# Patient Record
Sex: Female | Born: 1940 | Race: White | Hispanic: No | Marital: Married | Smoking: Never smoker
Health system: Southern US, Community
[De-identification: ages and names within clinical notes are randomized; demographics above are authoritative.]

## PROBLEM LIST (undated history)

## (undated) DIAGNOSIS — Z9289 Personal history of other medical treatment: Secondary | ICD-10-CM

## (undated) DIAGNOSIS — I471 Supraventricular tachycardia, unspecified: Secondary | ICD-10-CM

## (undated) DIAGNOSIS — J309 Allergic rhinitis, unspecified: Secondary | ICD-10-CM

## (undated) DIAGNOSIS — K648 Other hemorrhoids: Secondary | ICD-10-CM

## (undated) DIAGNOSIS — E785 Hyperlipidemia, unspecified: Secondary | ICD-10-CM

## (undated) DIAGNOSIS — Z5189 Encounter for other specified aftercare: Secondary | ICD-10-CM

## (undated) DIAGNOSIS — K449 Diaphragmatic hernia without obstruction or gangrene: Secondary | ICD-10-CM

## (undated) DIAGNOSIS — H269 Unspecified cataract: Secondary | ICD-10-CM

## (undated) DIAGNOSIS — K219 Gastro-esophageal reflux disease without esophagitis: Secondary | ICD-10-CM

## (undated) DIAGNOSIS — M1990 Unspecified osteoarthritis, unspecified site: Secondary | ICD-10-CM

## (undated) DIAGNOSIS — M810 Age-related osteoporosis without current pathological fracture: Secondary | ICD-10-CM

## (undated) DIAGNOSIS — Z860101 Personal history of adenomatous and serrated colon polyps: Secondary | ICD-10-CM

## (undated) DIAGNOSIS — A048 Other specified bacterial intestinal infections: Secondary | ICD-10-CM

## (undated) DIAGNOSIS — Z86010 Personal history of colonic polyps: Secondary | ICD-10-CM

## (undated) DIAGNOSIS — T8149XA Infection following a procedure, other surgical site, initial encounter: Secondary | ICD-10-CM

## (undated) DIAGNOSIS — J45909 Unspecified asthma, uncomplicated: Secondary | ICD-10-CM

## (undated) HISTORY — DX: Supraventricular tachycardia: I47.1

## (undated) HISTORY — PX: OTHER SURGICAL HISTORY: SHX169

## (undated) HISTORY — PX: TOE SURGERY: SHX1073

## (undated) HISTORY — DX: Encounter for other specified aftercare: Z51.89

## (undated) HISTORY — DX: Personal history of adenomatous and serrated colon polyps: Z86.0101

## (undated) HISTORY — PX: TONSILLECTOMY: SUR1361

## (undated) HISTORY — DX: Age-related osteoporosis without current pathological fracture: M81.0

## (undated) HISTORY — DX: Gastro-esophageal reflux disease without esophagitis: K21.9

## (undated) HISTORY — DX: Personal history of other medical treatment: Z92.89

## (undated) HISTORY — PX: COLONOSCOPY: SHX174

## (undated) HISTORY — DX: Allergic rhinitis, unspecified: J30.9

## (undated) HISTORY — DX: Hyperlipidemia, unspecified: E78.5

## (undated) HISTORY — DX: Other specified bacterial intestinal infections: A04.8

## (undated) HISTORY — DX: Diaphragmatic hernia without obstruction or gangrene: K44.9

## (undated) HISTORY — PX: TOTAL ABDOMINAL HYSTERECTOMY: SHX209

## (undated) HISTORY — DX: Other hemorrhoids: K64.8

## (undated) HISTORY — DX: Unspecified cataract: H26.9

## (undated) HISTORY — DX: Unspecified osteoarthritis, unspecified site: M19.90

## (undated) HISTORY — DX: Unspecified asthma, uncomplicated: J45.909

## (undated) HISTORY — DX: Personal history of colonic polyps: Z86.010

## (undated) HISTORY — PX: BILATERAL SALPINGOOPHORECTOMY: SHX1223

## (undated) HISTORY — DX: Supraventricular tachycardia, unspecified: I47.10

## (undated) HISTORY — PX: APPENDECTOMY: SHX54

---

## 2005-02-20 ENCOUNTER — Ambulatory Visit: Payer: Self-pay | Admitting: Internal Medicine

## 2005-02-21 ENCOUNTER — Ambulatory Visit: Payer: Self-pay | Admitting: Internal Medicine

## 2005-07-02 ENCOUNTER — Ambulatory Visit: Payer: Self-pay | Admitting: Internal Medicine

## 2005-10-23 ENCOUNTER — Ambulatory Visit: Payer: Self-pay | Admitting: Internal Medicine

## 2006-02-05 ENCOUNTER — Ambulatory Visit: Payer: Self-pay | Admitting: Internal Medicine

## 2006-03-18 ENCOUNTER — Ambulatory Visit: Payer: Self-pay | Admitting: Internal Medicine

## 2006-03-19 ENCOUNTER — Ambulatory Visit: Payer: Self-pay | Admitting: Internal Medicine

## 2006-07-30 ENCOUNTER — Ambulatory Visit: Payer: Self-pay | Admitting: Internal Medicine

## 2006-12-07 ENCOUNTER — Ambulatory Visit: Payer: Self-pay | Admitting: Internal Medicine

## 2007-03-08 ENCOUNTER — Ambulatory Visit: Payer: Self-pay | Admitting: Internal Medicine

## 2007-04-19 ENCOUNTER — Ambulatory Visit: Payer: Self-pay | Admitting: Internal Medicine

## 2007-09-27 ENCOUNTER — Ambulatory Visit: Payer: Self-pay | Admitting: Internal Medicine

## 2008-02-11 ENCOUNTER — Ambulatory Visit: Payer: Self-pay | Admitting: Internal Medicine

## 2008-03-30 DIAGNOSIS — J45998 Other asthma: Secondary | ICD-10-CM

## 2008-03-30 DIAGNOSIS — I471 Supraventricular tachycardia: Secondary | ICD-10-CM

## 2008-03-30 DIAGNOSIS — J302 Other seasonal allergic rhinitis: Secondary | ICD-10-CM | POA: Insufficient documentation

## 2008-03-30 DIAGNOSIS — J3089 Other allergic rhinitis: Secondary | ICD-10-CM

## 2008-03-31 ENCOUNTER — Ambulatory Visit: Payer: Self-pay | Admitting: Internal Medicine

## 2008-04-10 ENCOUNTER — Telehealth: Payer: Self-pay | Admitting: Internal Medicine

## 2008-06-21 ENCOUNTER — Ambulatory Visit: Payer: Self-pay | Admitting: Internal Medicine

## 2008-11-28 ENCOUNTER — Ambulatory Visit: Payer: Self-pay | Admitting: Internal Medicine

## 2009-04-02 ENCOUNTER — Ambulatory Visit: Payer: Self-pay | Admitting: Internal Medicine

## 2009-04-03 ENCOUNTER — Ambulatory Visit: Payer: Self-pay | Admitting: Internal Medicine

## 2009-08-20 ENCOUNTER — Ambulatory Visit: Payer: Self-pay | Admitting: Internal Medicine

## 2009-08-20 DIAGNOSIS — J209 Acute bronchitis, unspecified: Secondary | ICD-10-CM

## 2009-09-07 ENCOUNTER — Encounter: Payer: Self-pay | Admitting: Internal Medicine

## 2009-09-12 ENCOUNTER — Telehealth: Payer: Self-pay | Admitting: Internal Medicine

## 2010-02-04 ENCOUNTER — Ambulatory Visit: Payer: Self-pay | Admitting: Internal Medicine

## 2010-05-23 ENCOUNTER — Ambulatory Visit: Payer: Self-pay | Admitting: Internal Medicine

## 2010-06-26 ENCOUNTER — Ambulatory Visit: Payer: Self-pay | Admitting: Internal Medicine

## 2010-10-29 ENCOUNTER — Ambulatory Visit: Payer: Self-pay | Admitting: Internal Medicine

## 2011-01-07 NOTE — Assessment & Plan Note (Signed)
Summary: rov/jd   Primary Provider/Referring Provider:  Grace [name]  CC:  follow up visit-asthma and allergies. No Complaints..  History of Present Illness: PAROXYSMAL ATRIAL TACHYCARDIA (ICD-427.0) ASTHMA (ICD-493.90) ALLERGIC RHINITIS (ICD-477.9)  03/31/08- Annual follow-up for Cathy [name].  Her asthma and allergic rhinitis have done well.  She has continued allergy vaccine at 1:10 at her primary physicianphysician's office in [facility].  Last week  she did happen to need an antibiotic and a shot for bronchitis symptoms.  She is feeling better, but has some residual left maxillary pressure discomfort.  04-26-09- Asthma, allergic rhinitis Dr Melvyn Neth office continues to give her allergy shots without problem "works great". Tolerated podiatric procedure on her toe under aneshesia without problem. we reviewed her respiratory meds which are doing well. Denies sinus pain/ headache, fever.  August 20, 2009--Presents for an acute office viist. complains of prod cough with yellow mucus, increased SOB x82month. Clear mucus and nasal congestion at first then thick yellow mucus for last 2 weeks. Denies chest pain, dyspnea, orthopnea, hemoptysis, fever, n/v/d, edema, headache,recent travel or antibiotics.    May 23, 2010- Asthma, allergic rhinitis Has done very well since last here. Continues allergy vaccine through her primary office. Rare need for rescue inhaler- usually uses it ahead of heavy exertion. Uses Qvar regularly. Han't needed Astepro in a year. Never smoked.      Asthma History    Initial Asthma Severity Rating:    Age range: 12+ years    Symptoms: 0-2 days/week    Nighttime Awakenings: 0-2/month    Interferes w/ normal activity: no limitations    SABA use (not for EIB): 0-2 days/week    Asthma Severity Assessment: Intermittent   Preventive Screening-Counseling & Management  Alcohol-Tobacco     Smoking Status: never  Current Medications  (verified): 1)  Qvar 40 Mcg/act Aers (Beclomethasone Dipropionate) .... 2 Puffs and Rinse Twice Daily 2)  Allergy Vaccine 1:10 .... At Dr. Melvyn Neth' Office 3)  Proventil Hfa 108 (90 Base) Mcg/act  Aers (Albuterol Sulfate) .... Inhale 2 Puffs Every 4 To 6 Hours As Needed 4)  Astepro 137 Mcg/spray  Soln (Azelastine Hcl) .... Take 1-2 Sprays in Each Nostril Two Times A Day As Needed 5)  Pindolol 5 Mg  Tabs (Pindolol) .... Take 1 Tablet By Mouth Once A Day 6)  Premarin 0.625 Mg Tabs (Estrogens Conjugated) .... Take 1 Tablet By Mouth Once A Day 7)  Actonel 35 Mg Tabs (Risedronate Sodium) .... Once A Week 8)  Vitamin D (Ergocalciferol) 50000 Unit Caps (Ergocalciferol) .... Weekly 9)  Dicyclomine Hcl 10 Mg Caps (Dicyclomine Hcl) .... Take 1-4 Once Daily 10)  Flexeril 5 Mg Tabs (Cyclobenzaprine Hcl) .... At Bedtime 11)  Caltrate 600 1500 Mg Tabs (Calcium Carbonate) .... Take 1 Once Daily 12)  Multivitamins  Tabs (Multiple Vitamin) .... Take 1 By Mouth Once Daily  Allergies (verified): 1)  ! 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  [employer]  Risk Factors: Smoking Status: never (05/23/2010)  Review of Systems      See HPI  The patient denies shortness of breath with  activity, shortness of breath at rest, productive cough, non-productive cough, coughing up blood, chest pain, irregular heartbeats, acid heartburn, indigestion, loss of appetite, weight change, abdominal pain, difficulty swallowing, sore throat, tooth/dental problems,  headaches, nasal congestion/difficulty breathing through nose, and sneezing.    Vital Signs:  Patient profile:   70 year old female Height:      67 inches Weight:      166 pounds BMI:     26.09 O2 Sat:      97 % on Room air Pulse rate:   65 / minute BP sitting:   114 / 62  (left arm) Cuff size:   regular  Vitals Entered By: Reynaldo Minium CMA (May 23, 2010 2:10 PM)  O2 Flow:  Room air CC: follow up visit-asthma and allergies. No Complaints.   Physical Exam  Additional Exam:  .General: A/Ox3; pleasant and cooperative, NAD, wdwn SKIN: no rash, lesions NODES: no lymphadenopathy HEENT: [place]/AT, EOM- WNL, Conjuctivae- clear, PERRLA, TM-WNL, Nose- pale clear drainage.  Throat- clear and wnl, Mallampati II NECK: Supple w/ fair ROM, JVD- none, normal carotid impulses w/o bruits Thyroid- normal to palpation CHEST: Clear to P&A HEART: RRR, no m/g/r heard ABDOMEN:  YNW:GNFA, nl pulses, no edema  NEURO: Grossly intact to observation      Impression & Recommendations:  Problem # 1:  ASTHMA (ICD-493.90) She is very well controlled- mild intermittent. We discussed recommendation from her insurance that she ask about PFT. We discussed this. She didn't want it unless I felt it was important. Her exam and hx do not suggest that PFT now would change treatment decisions.  Problem # 2:  ALLERGIC RHINITIS (ICD-477.9)  We will not refill Astepro for now. Her updated medication list for this problem includes:    Astepro 137 Mcg/spray Soln (Azelastine hcl) .Marland Kitchen... Take 1-2 sprays in each nostril two times a day as needed  Problem # 3:  PAROXYSMAL ATRIAL TACHYCARDIA (ICD-427.0) No problem in a long time. Discussed stimulant meds. Her updated medication list for this problem includes:    Pindolol 5 Mg Tabs (Pindolol) .Marland Kitchen... Take 1 tablet by mouth once a day  Medications Added to Medication List This Visit: 1)  Vitamin D (ergocalciferol) 50000 Unit Caps (Ergocalciferol) .... Weekly 2)   Dicyclomine Hcl 10 Mg Caps (Dicyclomine hcl) .... Take 1-4 once daily 3)  Flexeril 5 Mg Tabs (Cyclobenzaprine hcl) .... At bedtime 4)  Caltrate 600 1500 Mg Tabs (Calcium carbonate) .... Take 1 once daily 5)  Multivitamins Tabs (Multiple vitamin) .... Take 1 by mouth once daily  Other Orders: Est. Patient Level IV (21308)  Patient Instructions: 1)  Please schedule a follow-up appointment in 1 year. 2)  Refill scripts for Qvar and Proventil 3)  continue allergy vaccine. Prescriptions: PROVENTIL HFA 108 (90 BASE) MCG/ACT  AERS (ALBUTEROL SULFATE) inhale 2 puffs every 4 to 6 hours as needed  #1 x prn   Entered and Authorized by:   Waymon Budge MD   Signed by:   Waymon Budge MD on 05/23/2010   Method used:   Print then Give to Patient   RxID:   [id number] QVAR 40 MCG/ACT AERS (BECLOMETHASONE DIPROPIONATE) 2 puffs and rinse twice daily  #1 x prn   Entered and Authorized by:   Waymon Budge MD   Signed by:   Waymon Budge MD on 05/23/2010   Method used:   Print then Give to Patient   RxID:   [id number]

## 2011-04-01 ENCOUNTER — Ambulatory Visit (INDEPENDENT_AMBULATORY_CARE_PROVIDER_SITE_OTHER): Payer: BC Managed Care – PPO

## 2011-04-01 DIAGNOSIS — J309 Allergic rhinitis, unspecified: Secondary | ICD-10-CM

## 2011-04-25 NOTE — Assessment & Plan Note (Signed)
[facility]                             PULMONARY OFFICE NOTE   Cathy, [name]                      MRN:          [id number]  DATE:03/09/2007                            DOB:          1941-11-17    PROBLEM LIST:  1. Asthma.  2. Allergic rhinitis.  3. History of paroxysmal atrial tachycardia.   HISTORY:  One-year followup.  Good winter.  No special problems.  She  was using albuterol only before exercise.  No spring pollen problems as  yet; says she uses Nasonex.  She had pneumococcal vaccine around 2005.  She continues allergy vaccine 1-10 at her doctor's office, with no  problems.   MEDICATIONS:  1. Allergy vaccine.  2. Pulmacort one puff b.i.d.  3. Pindolol 5 mg.  4. Diazepam 5 mg.  5. Premarin 1.25 mg.  6. Actonel.  7. Nasonex.  8. Albuterol rescue inhaler p.r.n.   DRUG INTOLERANCE:  SULFA.   OBJECTIVE:  VITAL SIGNS:  Weight 156 pounds, which if correct is a 9  pound weight loss over the past year.  BP 118/78, pulse regular 91, room  air saturation 97%.  GENERAL:  She seems alert and comfortable.  HEENT:  Nasal airway is clear.  Oropharynx clear.  LUNGS:  Clear to percussion and auscultation.  HEART:  Heart sounds regular without murmur.   DIAGNOSTIC TESTING:  February 05, 2006 film showed slight hyperaeration,  with no active lung disease.   Pulmonary function tests from March 18, 2006 showed mild obstructive  disease, with small airway involvement and some response to  bronchodilator.  FEV1:FVC ratio was 0.68, with FEV1 after bronchodilator  2.80 (118% of predicted).  There was reversible slowing in small  airways.  Measured lung volumes were normal.  Diffusion was moderately  reduced at 64% of predicted.   IMPRESSION:  1. Asthma.  2. Allergic rhinitis.   PLAN:  Continue Pulmacort and p.r.n. albuterol.  Continue allergy  vaccine at 1-10.  Schedule return in one year, earlier p.r.n.     [name], MD, [name], FACP  Electronically Signed    CDY/MedQ  DD: 03/14/2007  DT: 03/15/2007  Job #: [id number]   cc:   Sharin Mons, M.D.

## 2011-04-28 ENCOUNTER — Ambulatory Visit: Payer: Self-pay | Admitting: Internal Medicine

## 2011-05-28 ENCOUNTER — Ambulatory Visit (INDEPENDENT_AMBULATORY_CARE_PROVIDER_SITE_OTHER): Payer: Medicare Other | Admitting: Internal Medicine

## 2011-05-28 ENCOUNTER — Encounter: Payer: Self-pay | Admitting: Internal Medicine

## 2011-05-28 DIAGNOSIS — J45909 Unspecified asthma, uncomplicated: Secondary | ICD-10-CM

## 2011-05-28 DIAGNOSIS — J309 Allergic rhinitis, unspecified: Secondary | ICD-10-CM

## 2011-05-28 NOTE — Progress Notes (Signed)
  Subjective:    Patient ID: Taaliyah Delpriore, female    DOB: 12-05-1941, 70 y.o.   MRN: [id number]  HPI 05/28/11 Last here May 23, 2010- note reviewed Has broken toes. Ohtherwise she says she has done very well and she is satisfied without reason to make changes. She continues allergy vaccine getting shots at Dr Majel Homer in [place].   Review of Systems     Objective:   Physical Exam General- Alert, Oriented, Affect-appropriate, Distress- none acute  Skin- rash-none, lesions- none, excoriation- none  Lymphadenopathy- none  Head- atraumatic  Eyes- Gross vision intact, PERRLA, conjunctivae clear, secretions  Ears- Hearing, canals, T- normal  Nose- Clear, no- Septal dev, mucus, polyps, erosion, perforation   Throat- Mallampati II , mucosa clear , drainage- none, tonsils- atrophic  Neck- flexible , trachea midline, no stridor , thyroid nl, carotid no bruit  Chest - symmetrical excursion , unlabored     Heart/CV- RRR , no murmur , no gallop  , no rub, nl s1 s2                     - JVD- none , edema- none, stasis changes- none, varices- none     Lung- clear to P&A, wheeze- none, cough- none , dullness-none, rub- none     Chest wall-   Abd- tender-no, distended-no, bowel sounds-present, HSM- no  Br/ Gen/ Rectal- Not done, not indicated  Extrem- cyanosis- none, clubbing, none, atrophy- none, strength- nl  Neuro- grossly intact to observation        Assessment & Plan:

## 2011-05-28 NOTE — Assessment & Plan Note (Signed)
Excellent control, rarely needing rescue inhaler.

## 2011-05-28 NOTE — Assessment & Plan Note (Signed)
She has been very well controlled and we agreed to contiue present treatment, including allergy vaccine.

## 2011-05-28 NOTE — Patient Instructions (Signed)
Continue allergy vaccine  Please call as needed 

## 2011-09-01 ENCOUNTER — Ambulatory Visit (INDEPENDENT_AMBULATORY_CARE_PROVIDER_SITE_OTHER): Payer: Medicare Other

## 2011-09-01 DIAGNOSIS — J309 Allergic rhinitis, unspecified: Secondary | ICD-10-CM

## 2011-10-03 ENCOUNTER — Other Ambulatory Visit: Payer: Self-pay | Admitting: Internal Medicine

## 2012-02-04 ENCOUNTER — Ambulatory Visit (INDEPENDENT_AMBULATORY_CARE_PROVIDER_SITE_OTHER): Payer: BC Managed Care – PPO

## 2012-02-04 DIAGNOSIS — J309 Allergic rhinitis, unspecified: Secondary | ICD-10-CM

## 2012-05-27 ENCOUNTER — Ambulatory Visit: Payer: Medicare Other | Admitting: Internal Medicine

## 2012-06-04 ENCOUNTER — Encounter: Payer: Self-pay | Admitting: Internal Medicine

## 2012-06-04 ENCOUNTER — Other Ambulatory Visit: Payer: BC Managed Care – PPO

## 2012-06-04 ENCOUNTER — Ambulatory Visit (INDEPENDENT_AMBULATORY_CARE_PROVIDER_SITE_OTHER): Payer: BC Managed Care – PPO | Admitting: Internal Medicine

## 2012-06-04 VITALS — BP 110/80 | HR 65 | Ht 67.0 in | Wt 167.2 lb

## 2012-06-04 DIAGNOSIS — K5229 Other allergic and dietetic gastroenteritis and colitis: Secondary | ICD-10-CM

## 2012-06-04 DIAGNOSIS — J309 Allergic rhinitis, unspecified: Secondary | ICD-10-CM

## 2012-06-04 DIAGNOSIS — J45909 Unspecified asthma, uncomplicated: Secondary | ICD-10-CM

## 2012-06-04 DIAGNOSIS — Z91018 Allergy to other foods: Secondary | ICD-10-CM

## 2012-06-04 DIAGNOSIS — J45998 Other asthma: Secondary | ICD-10-CM

## 2012-06-04 DIAGNOSIS — T781XXA Other adverse food reactions, not elsewhere classified, initial encounter: Secondary | ICD-10-CM

## 2012-06-04 MED ORDER — ALBUTEROL SULFATE HFA 108 (90 BASE) MCG/ACT IN AERS: 2.0000 | INHALATION_SPRAY | Freq: Four times a day (QID) | RESPIRATORY_TRACT | Status: DC | PRN

## 2012-06-04 MED ORDER — AZELASTINE HCL 0.1 % NA SOLN: 2.0000 | Freq: Two times a day (BID) | NASAL | Status: DC

## 2012-06-04 MED ORDER — BECLOMETHASONE DIPROPIONATE 40 MCG/ACT IN AERS: 2.0000 | INHALATION_SPRAY | Freq: Two times a day (BID) | RESPIRATORY_TRACT | Status: DC

## 2012-06-04 NOTE — Patient Instructions (Addendum)
Continue allergy vaccine  Scripts for med refills  Order- lab- Food Allergy IgE panel     Dx Food allergy  Please call as needed

## 2012-06-04 NOTE — Progress Notes (Signed)
Subjective:    Patient ID: Cathy [name], female    DOB: [birth year], 71 y.o.   MRN: [id number]  HPI 05/28/11 Last here May 23, 2010- note reviewed Has broken toes. Ohtherwise she says she has done very well and she is satisfied without reason to make changes. She continues allergy vaccine getting shots at Dr Majel Homer in [place].   06/04/12- [name] never smoker followed for allergic rhinitis, asthma, complicated by hx PAT. Still on vaccine and doing well-seems to be helping; also, denies any wheezing, SOB, cough, or congestion;Discuss possible food allergies She continues allergy vaccine without problems, GO. She had some stomach discomfort. She tried keeping a food diary and tried avoiding suspect foods but it made no difference. She tried reducing weak and acid foods. Her doctor has suggested she may have "food allergy". She denies wheeze or cough and has not needed her rescue inhaler or her Astelin nasal spray. She continues to use Qvar twice daily  ROS-see HPI Constitutional:   No-   weight loss, night sweats, fevers, chills, fatigue, lassitude. HEENT:   No-  headaches, difficulty swallowing, tooth/dental problems, sore throat,       No-  sneezing, itching, ear ache, nasal congestion, post nasal drip,  CV:  No-   chest pain, orthopnea, PND, swelling in lower extremities, anasarca, dizziness, palpitations Resp: No-   shortness of breath with exertion or at rest.              No-   productive cough,  No non-productive cough,  No- coughing up of blood.              No-   change in color of mucus.  No- wheezing.   Skin: No-   rash or lesions. GI:  No-   heartburn, indigestion, +abdominal pain, nausea, vomiting,                 GU:. MS:  +  joint pain or swelling.  Neuro-     nothing unusual Psych:  No- change in mood or affect. No depression or anxiety.  No memory loss.  OBJ- Physical Exam General- Alert, Oriented, Affect-appropriate, Distress- none acute Skin- rash-none,  lesions- none, excoriation- none Lymphadenopathy- none Head- atraumatic            Eyes- Gross vision intact, PERRLA, conjunctivae and secretions clear            Ears- Hearing, canals-normal            Nose- Clear, no-Septal dev, mucus, polyps, erosion, perforation             Throat- Mallampati II , mucosa clear , drainage- none, tonsils- atrophic Neck- flexible , trachea midline, no stridor , thyroid nl, carotid no bruit Chest - symmetrical excursion , unlabored           Heart/CV- RRR , no murmur , no gallop  , no rub, nl s1 s2                           - JVD- none , edema- none, stasis changes- none, varices- none           Lung- clear to P&A, wheeze- none, cough- none , dullness-none, rub- none           Chest wall-  Abd- Br/ Gen/ Rectal- Not done, not indicated Extrem- cyanosis- none, clubbing, none, atrophy- none, strength- nl. Brace right hand for thumb injury Neuro-  grossly intact to observation and

## 2012-06-08 LAB — ALLERGEN FOOD PROFILE SPECIFIC IGE
Orange: <0.1 kU/L (ref <0.35)
Peanut IgE: <0.1 kU/L (ref <0.35)
Soybean IgE: <0.1 kU/L (ref <0.35)
Tomato IgE: <0.1 kU/L (ref <0.35)
Tuna IgE: <0.1 kU/L (ref <0.35)
Wheat IgE: <0.1 kU/L (ref <0.35)

## 2012-06-09 ENCOUNTER — Ambulatory Visit (INDEPENDENT_AMBULATORY_CARE_PROVIDER_SITE_OTHER): Payer: BC Managed Care – PPO

## 2012-06-09 DIAGNOSIS — J309 Allergic rhinitis, unspecified: Secondary | ICD-10-CM

## 2012-06-09 DIAGNOSIS — K5229 Other allergic and dietetic gastroenteritis and colitis: Secondary | ICD-10-CM | POA: Insufficient documentation

## 2012-06-09 NOTE — Assessment & Plan Note (Signed)
Mild intermittent asthma now well controlled with Qvar and rarely needing a rescue inhaler.

## 2012-06-09 NOTE — Assessment & Plan Note (Signed)
Controlled with Flonase 

## 2012-06-09 NOTE — Assessment & Plan Note (Addendum)
Gastroenteritis-type complaints possibly related to a specific food. She has not been able to find any pattern some appropriate measures such as food diary and avoidance of wheat. I suspect this will not be a food intolerance. Plan-food IgE allergy profile

## 2012-06-09 NOTE — Progress Notes (Signed)
Quick Note:  LMTCB ______ 

## 2012-06-11 ENCOUNTER — Telehealth: Payer: Self-pay | Admitting: Internal Medicine

## 2012-06-11 NOTE — Telephone Encounter (Signed)
Called and spoke with pt and she is aware of lab results per CY.  Nothing further needed.

## 2012-06-11 NOTE — Progress Notes (Signed)
Quick Note:  LMTCB ______ 

## 2012-11-10 ENCOUNTER — Ambulatory Visit (INDEPENDENT_AMBULATORY_CARE_PROVIDER_SITE_OTHER): Payer: BC Managed Care – PPO

## 2012-11-10 DIAGNOSIS — J309 Allergic rhinitis, unspecified: Secondary | ICD-10-CM

## 2013-04-04 ENCOUNTER — Ambulatory Visit (INDEPENDENT_AMBULATORY_CARE_PROVIDER_SITE_OTHER): Payer: Medicare Other

## 2013-04-04 DIAGNOSIS — J309 Allergic rhinitis, unspecified: Secondary | ICD-10-CM

## 2013-06-06 ENCOUNTER — Ambulatory Visit (INDEPENDENT_AMBULATORY_CARE_PROVIDER_SITE_OTHER): Payer: Medicare Other | Admitting: Internal Medicine

## 2013-06-06 ENCOUNTER — Ambulatory Visit (INDEPENDENT_AMBULATORY_CARE_PROVIDER_SITE_OTHER)
Admission: RE | Admit: 2013-06-06 | Discharge: 2013-06-06 | Disposition: A | Discharge status: Home / Self Care | Payer: Medicare Other | Source: Ambulatory Visit | Attending: Internal Medicine | Admitting: Internal Medicine

## 2013-06-06 ENCOUNTER — Encounter: Payer: Self-pay | Admitting: Internal Medicine

## 2013-06-06 VITALS — BP 140/78 | HR 73 | Ht 65.0 in | Wt 167.2 lb

## 2013-06-06 DIAGNOSIS — J309 Allergic rhinitis, unspecified: Secondary | ICD-10-CM

## 2013-06-06 DIAGNOSIS — J45998 Other asthma: Secondary | ICD-10-CM

## 2013-06-06 DIAGNOSIS — R05 Cough: Secondary | ICD-10-CM

## 2013-06-06 DIAGNOSIS — J3089 Other allergic rhinitis: Secondary | ICD-10-CM

## 2013-06-06 DIAGNOSIS — J45909 Unspecified asthma, uncomplicated: Secondary | ICD-10-CM

## 2013-06-06 NOTE — Progress Notes (Signed)
Subjective:    Patient ID: Cathy [name], female    DOB: [birth year], 72 y.o.   MRN: [id number]  HPI 05/28/11 Last here May 23, 2010- note reviewed Has broken toes. Ohtherwise she says she has done very well and she is satisfied without reason to make changes. She continues allergy vaccine getting shots at Dr Majel Homer in [place].   06/04/12- [name] never smoker followed for allergic rhinitis, asthma, complicated by hx PAT. Still on vaccine and doing well-seems to be helping; also, denies any wheezing, SOB, cough, or congestion;Discuss possible food allergies She continues allergy vaccine without problems, GO. She had some stomach discomfort. She tried keeping a food diary and tried avoiding suspect foods but it made no difference. She tried reducing weak and acid foods. Her doctor has suggested she may have "food allergy". She denies wheeze or cough and has not needed her rescue inhaler or her Astelin nasal spray. She continues to use Qvar twice daily  06/06/13- [name] never smoker followed for allergic rhinitis, asthma, complicated by hx PAT FOLLOWS FOR: slight cough-non productive; still on allergy vaccine 1:10 GO and no reactions. Dry cough for 6 months especially when lying down. Dyspnea on exertion climbing stairs but no wheeze as she continues Qvar 40. No active cardiac problem on pindolol.  ROS-see HPI Constitutional:   No-   weight loss, night sweats, fevers, chills, fatigue, lassitude. HEENT:   No-  headaches, difficulty swallowing, tooth/dental problems, sore throat,       No-  sneezing, itching, ear ache, nasal congestion, post nasal drip,  CV:  No-   chest pain, orthopnea, PND, swelling in lower extremities, anasarca, dizziness, palpitations Resp: No-   shortness of breath with exertion or at rest.              No-   productive cough,  + non-productive cough,  No- coughing up of blood.              No-   change in color of mucus.  No- wheezing.   Skin: No-   rash or  lesions. GI:  No-   heartburn, indigestion, +abdominal pain, nausea, vomiting,                 GU:. MS:  +  joint pain or swelling.  Neuro-     nothing unusual Psych:  No- change in mood or affect. No depression or anxiety.  No memory loss.  OBJ- Physical Exam General- Alert, Oriented, Affect-appropriate, Distress- none acute Skin- rash-none, lesions- none, excoriation- none Lymphadenopathy- none Head- atraumatic            Eyes- Gross vision intact, PERRLA, conjunctivae and secretions clear            Ears- Hearing, canals-normal            Nose- Clear, no-Septal dev, mucus, polyps, erosion, perforation             Throat- Mallampati II , mucosa clear , drainage- none, tonsils- atrophic Neck- flexible , trachea midline, no stridor , thyroid nl, carotid no bruit Chest - symmetrical excursion , unlabored           Heart/CV- RRR , no murmur , no gallop  , no rub, nl s1 s2                           - JVD- none , edema- none, stasis changes- none, varices- none  Lung- clear to P&A, wheeze- none, cough- none , dullness-none, rub- none           Chest wall-  Abd- Br/ Gen/ Rectal- Not done, not indicated Extrem- cyanosis- none, clubbing, none, atrophy- none, strength- nl.  Neuro- grossly intact to observation and

## 2013-06-06 NOTE — Patient Instructions (Addendum)
We can continue allergy vaccine 1:10 GO  Order CXR  Dx cough   Call report to her cell #  Consider trying otc cough syrup Delsym if needed.

## 2013-06-15 NOTE — Progress Notes (Signed)
Quick Note:  LMTCB ______ 

## 2013-06-17 NOTE — Progress Notes (Signed)
Quick Note:  LMTCB ______ 

## 2013-06-19 NOTE — Assessment & Plan Note (Signed)
Plan-continue allergy vaccine. Supplement with antihistamine if needed

## 2013-06-19 NOTE — Assessment & Plan Note (Signed)
Watch effect of beta blocker. Plan-chest x-ray. Delsym.

## 2013-06-21 NOTE — Progress Notes (Signed)
Quick Note:  lmomtcb for pt ______ 

## 2013-06-27 ENCOUNTER — Telehealth: Payer: Self-pay | Admitting: Internal Medicine

## 2013-06-27 NOTE — Telephone Encounter (Signed)
Result Note    CXR- lungs are clear, with no obvious acute problem to cause cough.   Pt aware of results; no questions or concerns.

## 2013-06-27 NOTE — Progress Notes (Signed)
Quick Note:  Pt aware of results; no questions or concerns ______

## 2013-07-13 ENCOUNTER — Other Ambulatory Visit: Payer: Self-pay

## 2013-07-21 ENCOUNTER — Encounter: Payer: Self-pay | Admitting: Internal Medicine

## 2013-07-21 NOTE — Telephone Encounter (Signed)
Please order PFT for dx Allergic-Infective Asthma

## 2013-07-21 NOTE — Telephone Encounter (Signed)
In Dr. Roxy Cedar last OV note he does not mention anything about a breathing test. Will forward to Dr. Maple Hudson just to confirm this. Please advise thanks

## 2013-07-22 ENCOUNTER — Telehealth: Payer: Self-pay | Admitting: Internal Medicine

## 2013-07-22 NOTE — Telephone Encounter (Signed)
We received this message through MyChart:  Since the x-Ray did not show any problems, do I need a breathing test?  I plan to have a stress test when I can get an appointment.  CY - please advise if you would like the patient to have a breathing test. Thanks.

## 2013-09-29 ENCOUNTER — Ambulatory Visit (INDEPENDENT_AMBULATORY_CARE_PROVIDER_SITE_OTHER): Payer: Medicare Other

## 2013-09-29 DIAGNOSIS — J309 Allergic rhinitis, unspecified: Secondary | ICD-10-CM

## 2013-10-13 ENCOUNTER — Other Ambulatory Visit: Payer: Self-pay

## 2014-02-24 ENCOUNTER — Ambulatory Visit (INDEPENDENT_AMBULATORY_CARE_PROVIDER_SITE_OTHER): Payer: Medicare Other

## 2014-02-24 DIAGNOSIS — J309 Allergic rhinitis, unspecified: Secondary | ICD-10-CM

## 2014-06-06 ENCOUNTER — Encounter: Payer: Self-pay | Admitting: Internal Medicine

## 2014-06-06 ENCOUNTER — Ambulatory Visit (INDEPENDENT_AMBULATORY_CARE_PROVIDER_SITE_OTHER): Payer: Medicare Other | Admitting: Internal Medicine

## 2014-06-06 VITALS — BP 118/72 | HR 62 | Ht 65.0 in | Wt 170.0 lb

## 2014-06-06 DIAGNOSIS — J3089 Other allergic rhinitis: Principal | ICD-10-CM

## 2014-06-06 DIAGNOSIS — J309 Allergic rhinitis, unspecified: Secondary | ICD-10-CM

## 2014-06-06 DIAGNOSIS — J302 Other seasonal allergic rhinitis: Secondary | ICD-10-CM

## 2014-06-06 DIAGNOSIS — J45998 Other asthma: Secondary | ICD-10-CM

## 2014-06-06 DIAGNOSIS — J45909 Unspecified asthma, uncomplicated: Secondary | ICD-10-CM

## 2014-06-06 MED ORDER — ALBUTEROL SULFATE HFA 108 (90 BASE) MCG/ACT IN AERS: 2.0000 | INHALATION_SPRAY | Freq: Four times a day (QID) | RESPIRATORY_TRACT | Status: DC | PRN

## 2014-06-06 MED ORDER — BECLOMETHASONE DIPROPIONATE 40 MCG/ACT IN AERS: 2.0000 | INHALATION_SPRAY | Freq: Two times a day (BID) | RESPIRATORY_TRACT | Status: DC

## 2014-06-06 MED ORDER — TIOTROPIUM BROMIDE MONOHYDRATE 2.5 MCG/ACT IN AERS: 2.0000 | INHALATION_SPRAY | Freq: Every day | RESPIRATORY_TRACT | Status: DC

## 2014-06-06 NOTE — Progress Notes (Signed)
Subjective:    Patient ID: Cathy [name], female    DOB: 02/20/1941, 73 y.o.   MRN: [id number]  HPI 05/28/11 Last here May 23, 2010- note reviewed Has broken toes. Ohtherwise she says she has done very well and she is satisfied without reason to make changes. She continues allergy vaccine getting shots at Dr Majel HomerWiliam Lewis in [place].   06/04/12- [name] never smoker followed for allergic rhinitis, asthma, complicated by hx PAT. Still on vaccine and doing well-seems to be helping; also, denies any wheezing, SOB, cough, or congestion;Discuss possible food allergies She continues allergy vaccine without problems, GO. She had some stomach discomfort. She tried keeping a food diary and tried avoiding suspect foods but it made no difference. She tried reducing weak and acid foods. Her doctor has suggested she may have "food allergy". She denies wheeze or cough and has not needed her rescue inhaler or her Astelin nasal spray. She continues to use Qvar twice daily  06/06/13- [name] never smoker followed for allergic rhinitis, asthma, complicated by hx PAT FOLLOWS FOR: slight cough-non productive; still on allergy vaccine 1:10 GO and no reactions. Dry cough for 6 months especially when lying down. Dyspnea on exertion climbing stairs but no wheeze as she continues Qvar 40. No active cardiac problem on pindolol.  06/06/14- [name] never smoker followed for allergic rhinitis, asthma, complicated by hx PAT FOLLOWS FOR: still on Allergy vaccine 1:10-gets through PCP Dr Majel [place]Wiliam [place].  She reports allergy vaccine does help her and there've been no problems. Still complains of dyspnea on exertion climbing stairs but describes walking 2 miles without problems. Not anemic. Stress test result reported "good". Not sensitive to whether change. Little benefit from rescue inhaler but continues Qvar and has Proventil.  ROS-see HPI Constitutional:   No-   weight loss, night sweats, fevers, chills,  fatigue, lassitude. HEENT:   No-  headaches, difficulty swallowing, tooth/dental problems, sore throat,       No-  sneezing, itching, ear ache, nasal congestion, post nasal drip,  CV:  No-   chest pain, orthopnea, PND, swelling in lower extremities, anasarca, dizziness, palpitations Resp: +shortness of breath with exertion or at rest.              No-   productive cough,   non-productive cough,  No- coughing up of blood.              No-   change in color of mucus.  No- wheezing.   Skin: No-   rash or lesions. GI:  No-   heartburn, indigestion, abdominal pain, nausea, vomiting,                 GU:. MS:  +  joint pain or swelling.  Neuro-     nothing unusual Psych:  No- change in mood or affect. No depression or anxiety.  No memory loss.  OBJ- Physical Exam General- Alert, Oriented, Affect-appropriate, Distress- none acute Skin- rash-none, lesions- none, excoriation- none Lymphadenopathy- none Head- atraumatic            Eyes- Gross vision intact, PERRLA, conjunctivae and secretions clear            Ears- Hearing, canals-normal            Nose- Clear, no-Septal dev, mucus, polyps, erosion, perforation             Throat- Mallampati II , mucosa clear , drainage- none, tonsils- atrophic Neck- flexible , trachea midline, no  stridor , thyroid nl, carotid no bruit Chest - symmetrical excursion , unlabored           Heart/CV- RRR , no murmur , no gallop  , no rub, nl s1 s2                           - JVD- none , edema- none, stasis changes- none, varices- none           Lung- clear to P&A, wheeze- none, cough- none , dullness-none, rub- none           Chest wall-  Abd- Br/ Gen/ Rectal- Not done, not indicated Extrem- cyanosis- none, clubbing, none, atrophy- none, strength- nl.  Neuro- grossly intact to observation and

## 2014-06-06 NOTE — Patient Instructions (Addendum)
We can continue allergy vaccine 1:10 at Dr Melvyn NethLewis' office  Sample Spiriva Respimat  2 puffs, once daily   See if this helps your sense of shortness of breath with exertion  Please call as needed  Refills printed for Qvar and Proventil

## 2014-06-15 ENCOUNTER — Telehealth: Payer: Self-pay | Admitting: Internal Medicine

## 2014-06-15 NOTE — Telephone Encounter (Signed)
I called spoke with pt. Aware of recs. She will try this and let us know. Nothing further needed

## 2014-06-15 NOTE — Telephone Encounter (Signed)
Pt was seen by CY on 06/06/14 with the following instructions:  Patient Instructions     We can continue allergy vaccine 1:10 at Dr Melvyn NethLewis' office  Sample Spiriva Respimat 2 puffs, once daily See if this helps your sense of shortness of breath with exertion  Please call as needed  Refills printed for Qvar and Proventil   --------  Called, spoke with pt.  She started the Spiriva Respimat on Wednesday, July 1 - 2 puffs qd.  Reports on Friday, July 3, she started having sore throat, hoarseness, and PND. On Monday, July 6, she started having problems with not being able to empty bladder completely.  States she is rinsing mouth and brushing teeth well after using the inhaler.  She has noticed DOE has improved with this medication but would like to know if there is something else she can try.  Dr. Maple HudsonYoung, pls advise.  Also, pt reports CY asked if she had SOB when bending over.  Pt wasn't sure during visit but has since noticed that she does have SOB when bending over.  She would like CY to be aware of this.    [facility] [place] [place]  Current Outpatient Prescriptions on File Prior to Visit  Medication Sig Dispense Refill  . albuterol (PROVENTIL HFA) 108 (90 BASE) MCG/ACT inhaler Inhale 2 puffs into the lungs every 6 (six) hours as needed for wheezing or shortness of breath.  1 Inhaler  prn  . alendronate (FOSAMAX) 35 MG tablet Take 1 tablet by mouth Once a week.      . beclomethasone (QVAR) 40 MCG/ACT inhaler Inhale 2 puffs into the lungs 2 (two) times daily. Rinse mouth  1 Inhaler  prn  . Calcium Carbonate (CALTRATE 600) 1500 MG TABS Take by mouth daily.        . cyclobenzaprine (FLEXERIL) 5 MG tablet Take 5 mg by mouth at bedtime.        Marland Kitchen. KRILL OIL 1000 MG CAPS Take 1 capsule by mouth daily.      . Multiple Vitamin (MULTIVITAMIN) tablet Take 1 tablet by mouth daily.        . pindolol (VISKEN) 5 MG tablet Take 5 mg by mouth daily.        . Tiotropium Bromide Monohydrate (SPIRIVA RESPIMAT)  2.5 MCG/ACT AERS Inhale 2 puffs into the lungs daily.  1 Inhaler  0   No current facility-administered medications on file prior to visit.

## 2014-06-15 NOTE — Telephone Encounter (Signed)
Suggest try reducing the Spiriva Respimat to 1 puff, one time daily. See if it still helps breathing with less problem with bladder

## 2014-08-02 ENCOUNTER — Ambulatory Visit (INDEPENDENT_AMBULATORY_CARE_PROVIDER_SITE_OTHER): Payer: Medicare Other

## 2014-08-02 DIAGNOSIS — J309 Allergic rhinitis, unspecified: Secondary | ICD-10-CM

## 2014-08-27 NOTE — Assessment & Plan Note (Signed)
She is not describing wheeze and there may not be much reversible basis for her dyspnea with exertion Plan-try one last medication sample-Spiriva Respimat

## 2014-08-27 NOTE — Assessment & Plan Note (Signed)
Continues allergy vaccine 1:10 with injections given at the office of her primary physician

## 2014-09-22 ENCOUNTER — Other Ambulatory Visit: Payer: Self-pay

## 2014-10-16 ENCOUNTER — Encounter: Payer: Self-pay | Admitting: Internal Medicine

## 2015-01-01 ENCOUNTER — Ambulatory Visit (INDEPENDENT_AMBULATORY_CARE_PROVIDER_SITE_OTHER): Payer: Medicare Other

## 2015-01-01 DIAGNOSIS — J309 Allergic rhinitis, unspecified: Secondary | ICD-10-CM

## 2015-05-04 ENCOUNTER — Telehealth: Payer: Self-pay | Admitting: Internal Medicine

## 2015-05-04 ENCOUNTER — Ambulatory Visit (INDEPENDENT_AMBULATORY_CARE_PROVIDER_SITE_OTHER): Payer: Medicare Other

## 2015-05-04 DIAGNOSIS — J309 Allergic rhinitis, unspecified: Secondary | ICD-10-CM | POA: Diagnosis not present

## 2015-06-04 ENCOUNTER — Other Ambulatory Visit: Payer: Self-pay

## 2015-06-06 NOTE — Telephone Encounter (Signed)
Date Mixed: 05/04/15 Vial: 1 Strength: 1:10 Here/Mail/Pick Up: mail Mixed By: tbs 

## 2015-06-07 ENCOUNTER — Ambulatory Visit: Payer: Medicare Other | Admitting: Internal Medicine

## 2015-07-13 ENCOUNTER — Ambulatory Visit (INDEPENDENT_AMBULATORY_CARE_PROVIDER_SITE_OTHER): Payer: Medicare Other | Admitting: Internal Medicine

## 2015-07-13 ENCOUNTER — Encounter: Payer: Self-pay | Admitting: Internal Medicine

## 2015-07-13 VITALS — BP 108/68 | HR 66 | Ht 65.0 in | Wt 167.0 lb

## 2015-07-13 DIAGNOSIS — J3089 Other allergic rhinitis: Principal | ICD-10-CM

## 2015-07-13 DIAGNOSIS — J302 Other seasonal allergic rhinitis: Secondary | ICD-10-CM

## 2015-07-13 DIAGNOSIS — J309 Allergic rhinitis, unspecified: Secondary | ICD-10-CM | POA: Diagnosis not present

## 2015-07-13 DIAGNOSIS — J45998 Other asthma: Secondary | ICD-10-CM | POA: Diagnosis not present

## 2015-07-13 DIAGNOSIS — G4700 Insomnia, unspecified: Secondary | ICD-10-CM | POA: Diagnosis not present

## 2015-07-13 MED ORDER — BECLOMETHASONE DIPROPIONATE 40 MCG/ACT IN AERS: 2.0000 | INHALATION_SPRAY | Freq: Two times a day (BID) | RESPIRATORY_TRACT | Status: DC

## 2015-07-13 MED ORDER — SUVOREXANT 15 MG PO TABS: 1.0000 | ORAL_TABLET | Freq: Every day | ORAL | Status: DC

## 2015-07-13 NOTE — Patient Instructions (Signed)
We can continue allergy vaccine at 0.5 ml/ 1:10    It would be ok to try getting your shots every 2 weeks   Sample Belsomra 15 mg   1 daily at bedtime for sleep. 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   Refill script printed for Qvar

## 2015-07-13 NOTE — Progress Notes (Signed)
Subjective:    Patient ID: Cathy [name], female    DOB: Feb 02, 1941, 74 y.o.   MRN: [id number]  HPI 05/28/11 Last here May 23, 2010- note reviewed Has broken toes. Ohtherwise she says she has done very well and she is satisfied without reason to make changes. She continues allergy vaccine getting shots at Dr Cathy [name] in [place].   06/04/12- [name] never smoker followed for allergic rhinitis, asthma, complicated by hx PAT. Still on vaccine and doing well-seems to be helping; also, denies any wheezing, SOB, cough, or congestion;Discuss possible food allergies She continues allergy vaccine without problems, GO. She had some stomach discomfort. She tried keeping a food diary and tried avoiding suspect foods but it made no difference. She tried reducing weak and acid foods. Her doctor has suggested she may have "food allergy". She denies wheeze or cough and has not needed her rescue inhaler or her Astelin nasal spray. She continues to use Qvar twice daily  06/06/13- [name] never smoker followed for allergic rhinitis, asthma, complicated by hx PAT FOLLOWS FOR: slight cough-non productive; still on allergy vaccine 1:10 GO and no reactions. Dry cough for 6 months especially when lying down. Dyspnea on exertion climbing stairs but no wheeze as she continues Qvar 40. No active cardiac problem on pindolol.  06/06/14- [name] never smoker followed for allergic rhinitis, asthma, complicated by hx PAT FOLLOWS FOR: still on Allergy vaccine 1:10-gets through PCP Dr Cathy [name] in [place].  She reports allergy vaccine does help her and there've been no problems. Still complains of dyspnea on exertion climbing stairs but describes walking 2 miles without problems. Not anemic. Stress test result reported "good". Not sensitive to weather change. Little benefit from rescue inhaler but continues Qvar and has Proventil.  07/13/15-  [name] never smoker followed for allergic rhinitis, asthma, complicated  by hx PAT FOLLOWS FOR: still on Allergy vaccine 1:10-gets through PCP Dr Cathy [name] in [place] Rare need for albuterol rescue inhaler but continues Qvar 40 daily. No recent PAT/tachycardia We discussed increasing the interval between her allergy vaccine injections to every other week. She may feel comfortable stopping in another year.  ROS-see HPI Constitutional:   No-   weight loss, night sweats, fevers, chills, fatigue, lassitude. HEENT:   No-  headaches, difficulty swallowing, tooth/dental problems, sore throat,       No-  sneezing, itching, ear ache, nasal congestion, post nasal drip,  CV:  No-   chest pain, orthopnea, PND, swelling in lower extremities, anasarca, dizziness, palpitations Resp: +shortness of breath with exertion or at rest.              No-   productive cough,   non-productive cough,  No- coughing up of blood.              No-   change in color of mucus.  No- wheezing.   Skin: No-   rash or lesions. GI:  No-   heartburn, indigestion, abdominal pain, nausea, vomiting,                 GU:. MS:  +  joint pain or swelling.  Neuro-     nothing unusual Psych:  No- change in mood or affect. No depression or anxiety.  No memory loss.  OBJ- Physical Exam General- Alert, Oriented, Affect-appropriate, Distress- none acute Skin- rash-none, lesions- none, excoriation- none Lymphadenopathy- none Head- atraumatic            Eyes- Gross vision intact, PERRLA,  conjunctivae and secretions clear            Ears- Hearing, canals-normal            Nose- Clear, no-Septal dev, mucus, polyps, erosion, perforation             Throat- Mallampati II , mucosa clear , drainage- none, tonsils- atrophic Neck- flexible , trachea midline, no stridor , thyroid nl, carotid no bruit Chest - symmetrical excursion , unlabored           Heart/CV- RRR , no murmur , no gallop  , no rub, nl s1 s2                           - JVD- none , edema- none, stasis changes- none, varices- none            Lung- clear to P&A, wheeze- none, cough- none , dullness-none, rub- none           Chest wall-  Abd- Br/ Gen/ Rectal- Not done, not indicated Extrem- cyanosis- none, clubbing, none, atrophy- none, strength- nl. + Walking boot left foot Neuro- grossly intact to observation and

## 2015-07-14 DIAGNOSIS — G4700 Insomnia, unspecified: Secondary | ICD-10-CM | POA: Insufficient documentation

## 2015-07-14 NOTE — Assessment & Plan Note (Signed)
Rare need now for rescue inhaler. Asthma does not wake her at night. She continues maintenance Qvar 40.

## 2015-07-14 NOTE — Assessment & Plan Note (Signed)
She has done well with allergy vaccine and has had a significant reduction in both rhinitis and asthma symptoms. Plan-increase the interval between allergy injections to every other week

## 2015-07-14 NOTE — Assessment & Plan Note (Signed)
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 We briefly discussed basic sleep hygiene. I gave a couple of sample packs of Belsomra 15 mg. If she likes it she can discuss it with her primary physician.

## 2015-07-27 ENCOUNTER — Other Ambulatory Visit: Payer: Self-pay

## 2015-07-27 DIAGNOSIS — Z1231 Encounter for screening mammogram for malignant neoplasm of breast: Secondary | ICD-10-CM

## 2015-08-02 ENCOUNTER — Encounter: Payer: Self-pay | Admitting: Internal Medicine

## 2015-08-30 ENCOUNTER — Ambulatory Visit
Admission: RE | Admit: 2015-08-30 | Discharge: 2015-08-30 | Disposition: A | Discharge status: Home / Self Care | Payer: Medicare Other | Source: Ambulatory Visit

## 2015-08-30 DIAGNOSIS — Z1231 Encounter for screening mammogram for malignant neoplasm of breast: Secondary | ICD-10-CM

## 2015-12-09 HISTORY — PX: ANKLE FRACTURE SURGERY: SHX122

## 2015-12-20 ENCOUNTER — Encounter: Payer: Self-pay | Admitting: Internal Medicine

## 2016-01-25 ENCOUNTER — Encounter: Payer: Self-pay | Admitting: Internal Medicine

## 2016-01-25 NOTE — Telephone Encounter (Signed)
CY patient sent message stating insurance will cover Flovent inhaler and would like to have Rx sent to [facility]. Pt states she still has 2 Qvar inhalers to use. Thanks.

## 2016-01-29 NOTE — Telephone Encounter (Signed)
Ok to order Flovent 44, # 1,  Inhale 2 puffs then rinse mouth, twice daily,   Refill x 12   As she requestss

## 2016-01-30 ENCOUNTER — Encounter: Payer: Self-pay | Admitting: Internal Medicine

## 2016-03-08 DIAGNOSIS — T8149XA Infection following a procedure, other surgical site, initial encounter: Secondary | ICD-10-CM

## 2016-03-08 HISTORY — DX: Infection following a procedure, other surgical site, initial encounter: T81.49XA

## 2016-03-11 ENCOUNTER — Inpatient Hospital Stay (HOSPITAL_COMMUNITY)
Admission: EM | Admit: 2016-03-11 | Discharge: 2016-03-14 | DRG: 493 | Disposition: A | Discharge status: Home Health | Payer: Medicare Other | Attending: Orthopedic Surgery | Admitting: Orthopedic Surgery

## 2016-03-11 ENCOUNTER — Emergency Department (HOSPITAL_COMMUNITY): Payer: Medicare Other

## 2016-03-11 ENCOUNTER — Encounter (HOSPITAL_COMMUNITY): Payer: Self-pay

## 2016-03-11 DIAGNOSIS — T8469XA Infection and inflammatory reaction due to internal fixation device of other site, initial encounter: Principal | ICD-10-CM | POA: Diagnosis present

## 2016-03-11 DIAGNOSIS — T847XXA Infection and inflammatory reaction due to other internal orthopedic prosthetic devices, implants and grafts, initial encounter: Secondary | ICD-10-CM | POA: Insufficient documentation

## 2016-03-11 DIAGNOSIS — L03116 Cellulitis of left lower limb: Secondary | ICD-10-CM | POA: Diagnosis present

## 2016-03-11 DIAGNOSIS — Z79899 Other long term (current) drug therapy: Secondary | ICD-10-CM

## 2016-03-11 DIAGNOSIS — G4700 Insomnia, unspecified: Secondary | ICD-10-CM | POA: Diagnosis present

## 2016-03-11 DIAGNOSIS — A4901 Methicillin susceptible Staphylococcus aureus infection, unspecified site: Secondary | ICD-10-CM | POA: Insufficient documentation

## 2016-03-11 DIAGNOSIS — K219 Gastro-esophageal reflux disease without esophagitis: Secondary | ICD-10-CM | POA: Diagnosis present

## 2016-03-11 DIAGNOSIS — T814XXA Infection following a procedure, initial encounter: Secondary | ICD-10-CM

## 2016-03-11 DIAGNOSIS — L0390 Cellulitis, unspecified: Secondary | ICD-10-CM | POA: Diagnosis present

## 2016-03-11 DIAGNOSIS — B9561 Methicillin susceptible Staphylococcus aureus infection as the cause of diseases classified elsewhere: Secondary | ICD-10-CM | POA: Diagnosis present

## 2016-03-11 DIAGNOSIS — Y798 Miscellaneous orthopedic devices associated with adverse incidents, not elsewhere classified: Secondary | ICD-10-CM | POA: Diagnosis present

## 2016-03-11 DIAGNOSIS — M86172 Other acute osteomyelitis, left ankle and foot: Secondary | ICD-10-CM | POA: Diagnosis present

## 2016-03-11 DIAGNOSIS — T8149XA Infection following a procedure, other surgical site, initial encounter: Secondary | ICD-10-CM | POA: Diagnosis present

## 2016-03-11 DIAGNOSIS — IMO0001 Reserved for inherently not codable concepts without codable children: Secondary | ICD-10-CM

## 2016-03-11 DIAGNOSIS — M25572 Pain in left ankle and joints of left foot: Secondary | ICD-10-CM | POA: Diagnosis not present

## 2016-03-11 DIAGNOSIS — Z113 Encounter for screening for infections with a predominantly sexual mode of transmission: Secondary | ICD-10-CM | POA: Insufficient documentation

## 2016-03-11 DIAGNOSIS — I471 Supraventricular tachycardia: Secondary | ICD-10-CM | POA: Diagnosis present

## 2016-03-11 DIAGNOSIS — J45909 Unspecified asthma, uncomplicated: Secondary | ICD-10-CM | POA: Diagnosis present

## 2016-03-11 HISTORY — DX: Infection following a procedure, other surgical site, initial encounter: T81.49XA

## 2016-03-11 LAB — BASIC METABOLIC PANEL
ANION GAP: 14 (ref 5–15)
BUN: 11 mg/dL (ref 6–20)
CALCIUM: 8.4 mg/dL — AB (ref 8.9–10.3)
CO2: 20 mmol/L — ABNORMAL LOW (ref 22–32)
CREATININE: 0.91 mg/dL (ref 0.44–1.00)
Chloride: 103 mmol/L (ref 101–111)
GFR calc Af Amer: >60 mL/min (ref >60)
GLUCOSE: 108 mg/dL — AB (ref 65–99)
Potassium: 3.9 mmol/L (ref 3.5–5.1)
Sodium: 137 mmol/L (ref 135–145)

## 2016-03-11 LAB — CBC
HCT: 32.8 % — ABNORMAL LOW (ref 36.0–46.0)
Hemoglobin: 10.7 g/dL — ABNORMAL LOW (ref 12.0–15.0)
MCH: 29.3 pg (ref 26.0–34.0)
MCHC: 32.6 g/dL (ref 30.0–36.0)
MCV: 89.9 fL (ref 78.0–100.0)
PLATELETS: 256 10*3/uL (ref 150–400)
RBC: 3.65 MIL/uL — ABNORMAL LOW (ref 3.87–5.11)
RDW: 13.7 % (ref 11.5–15.5)
WBC: 11.5 10*3/uL — AB (ref 4.0–10.5)

## 2016-03-11 MED ORDER — FENTANYL CITRATE (PF) 100 MCG/2ML IJ SOLN: 50.0000 ug | INTRAMUSCULAR | Status: DC | PRN

## 2016-03-11 MED ORDER — ACETAMINOPHEN 325 MG PO TABS
650.0000 mg | ORAL_TABLET | Freq: Once | ORAL | Status: AC
  Administered 2016-03-11: 650 mg via ORAL
  Filled 2016-03-11: qty 2

## 2016-03-11 MED ORDER — ACETAMINOPHEN 325 MG PO TABS: 650.0000 mg | ORAL_TABLET | Freq: Four times a day (QID) | ORAL | Status: DC | PRN

## 2016-03-11 MED ORDER — ONDANSETRON HCL 4 MG/2ML IJ SOLN: 4.0000 mg | Freq: Three times a day (TID) | INTRAMUSCULAR | Status: AC | PRN

## 2016-03-11 MED ORDER — VANCOMYCIN HCL IN DEXTROSE 1-5 GM/200ML-% IV SOLN
1000.0000 mg | Freq: Once | INTRAVENOUS | Status: AC
  Administered 2016-03-11: 1000 mg via INTRAVENOUS
  Filled 2016-03-11: qty 200

## 2016-03-11 MED ORDER — SODIUM CHLORIDE 0.9 % IV SOLN
Freq: Once | INTRAVENOUS | Status: AC
  Administered 2016-03-11: 19:00:00 via INTRAVENOUS

## 2016-03-11 NOTE — ED Notes (Signed)
Pt reports had surgery on left lower leg and ankle.  Seen ortho last week, pt reported has had pain on distal end of incision and a scab.  Onset 2 nights ago site reddened and now oozing yellow-bloody drainage.  No fevers.  Pt was sent here by Dr. Victorino DikeHewitt.

## 2016-03-11 NOTE — ED Provider Notes (Signed)
CSN: [id number]     Arrival date & time 03/11/16  1207 History   First MD Initiated Contact with Patient 03/11/16 1600     Chief Complaint  Patient presents with  . Wound Infection     (Consider location/radiation/quality/duration/timing/severity/associated sxs/prior Treatment) HPI Comments: Patient is a 75 y/o white female presenting with redness and swelling of her lower left leg. She states the swelling and redness began 2 days ago on her lateral ankle and has spread down into her foot and up her leg. She endorses copious drainage from the inferior area of her incision that began this morning when she believes the scab fell off. She states associated non-radiating sharp pain with walking and light touch. She has tried ice for the swelling which has helped but she has not taken anything for pain. She called Dr. Angelica PouHewitts office to be seen today but was unable to get an appointment. She had surgery to her left ankle in January for multiple broken bones and the incision has taken a long time to heal. She endorses chills but denies fever, sweats, nausea, vomiting, or weakness of her left leg. She denies recent illness, recent antibiotic use or any sick close contacts. She denies ever having MRSA.  The history is provided by the patient and the spouse. No language interpreter was used.    Past Medical History  Diagnosis Date  . Paroxysmal supraventricular tachycardia (HCC)   . Unspecified asthma(493.90)   . Allergic rhinitis, cause unspecified    Past Surgical History  Procedure Laterality Date  . Toe surgery    . Appendectomy    . Total abdominal hysterectomy    . Bilateral salpingoophorectomy    . Tonsillectomy    . Squamous cell cancer face    . Vocal cord nodules     History reviewed. No pertinent family history. Social History  Substance Use Topics  . Smoking status: Never Smoker   . Smokeless tobacco: Never Used  . Alcohol Use: No   OB History    No data available      Review of Systems  Constitutional: Positive for chills. Negative for fever, diaphoresis and activity change.  Eyes: Negative.  Negative for visual disturbance.  Respiratory: Negative.  Negative for cough and shortness of breath.   Cardiovascular: Positive for leg swelling. Negative for chest pain.  Gastrointestinal: Negative.   Endocrine: Negative.   Genitourinary: Negative.   Musculoskeletal: Positive for joint swelling.  Skin: Positive for color change and wound.       Redness, edema and warmth  Allergic/Immunologic: Negative.   Neurological: Positive for headaches. Negative for dizziness, syncope, weakness and light-headedness.      Allergies  Penicillins and Sulfonamide derivatives  Home Medications   Prior to Admission medications   Medication Sig Start Date End Date Taking? Authorizing Provider  albuterol (PROVENTIL HFA) 108 (90 BASE) MCG/ACT inhaler Inhale 2 puffs into the lungs every 6 (six) hours as needed for wheezing or shortness of breath. 06/06/14 06/07/16 Yes Clinton D Young, MD  alendronate (FOSAMAX) 35 MG tablet Take 1 tablet by mouth Once a week. 04/22/12  Yes Historical Provider, MD  atorvastatin (LIPITOR) 10 MG tablet Take 10 mg by mouth daily.   Yes Historical Provider, MD  beclomethasone (QVAR) 40 MCG/ACT inhaler Inhale 2 puffs into the lungs 2 (two) times daily. Rinse mouth 07/13/15 07/14/17 Yes Waymon Budgelinton D Young, MD  Biotin 1000 MCG tablet Take 1,000 mcg by mouth daily.   Yes Historical Provider, MD  Calcium  Carbonate (CALTRATE 600) 1500 MG TABS Take by mouth daily.     Yes Historical Provider, MD  etodolac (LODINE) 500 MG tablet Take 1 tablet by mouth 2 (two) times daily. 10/18/15  Yes Historical Provider, MD  fluticasone (FLONASE) 50 MCG/ACT nasal spray Place 2 sprays into the nose every evening. 05/04/15  Yes Historical Provider, MD  KRILL OIL 1000 MG CAPS Take 1 capsule by mouth daily.   Yes Historical Provider, MD  Magnesium 500 MG TABS Take 1,000 mg by mouth  daily.   Yes Historical Provider, MD  Multiple Vitamin (MULTIVITAMIN) tablet Take 1 tablet by mouth daily.     Yes Historical Provider, MD  pantoprazole (PROTONIX) 40 MG tablet Take 40 mg by mouth daily.   Yes Historical Provider, MD  pindolol (VISKEN) 5 MG tablet Take 5 mg by mouth daily.     Yes Historical Provider, MD  Suvorexant (BELSOMRA) 15 MG TABS Take 1 capsule by mouth at bedtime. 07/13/15  Yes Waymon Budge, MD  Vitamin D, Ergocalciferol, (DRISDOL) 50000 units CAPS capsule Take 1 capsule by mouth once a week. 05/21/15  Yes Historical Provider, MD   BP 122/55 mmHg  Pulse 91  Temp(Src) 98.8 F (37.1 C) (Oral)  Resp 18  Ht 5' 6.5" (1.689 m)  Wt 79.379 kg  BMI 27.83 kg/m2  SpO2 95% Physical Exam  Constitutional: She is oriented to person, place, and time. She appears well-developed and well-nourished. No distress.  HENT:  Head: Normocephalic and atraumatic.  Cardiovascular: Normal rate, regular rhythm and normal heart sounds.   2+ dorsalis pedis pulse of right foot, 1+ of left foot  Pulmonary/Chest: Effort normal and breath sounds normal.  Abdominal: Soft. There is no tenderness.  Musculoskeletal: She exhibits edema and tenderness.  Swelling and tenderness to the lateral left ankle  Neurological: She is alert and oriented to person, place, and time.  Skin: Skin is warm and dry. Rash noted. There is erythema.  Warmth, edema, redness, and shiny appearance noted just below the left knee to the dorsal aspect of the left foot, open wound noted to lateral left ankle just over the lateral malleolus. Partially healed Incision of approximately 6cm noted on lateral left ankle where the inferior aspect is open with copious amounts of serosanguinous drainage.   Psychiatric: She has a normal mood and affect.      ED Course  Procedures (including critical care time) Labs Review Labs Reviewed - No data to display  Imaging Review No results found. I have personally reviewed and  evaluated these images and lab results as part of my medical decision-making.   EKG Interpretation None      MDM   Final diagnoses:  Cellulitis of left foot    Patient presents with cellulitis of the left lower leg. Cultures have been collected. Orthopedics has been consulted, is admitting patient and will see patient in the morning. Ortho agrees with the antibiotic regiment. Patient is stable and well appearing with a WBC of 11.5 with no signs of sepsis.        Jerre Simon, PA 03/12/16 0111  Eber Hong, MD 03/12/16 [phone]

## 2016-03-11 NOTE — Progress Notes (Signed)
Cathy [name] [id number] Admitted to [id number]: 03/11/2016 9:51 PM Attending Provider: Toni ArthursJohn Hewitt, MD    Cathy [name] is a 75 y.o. female patient admitted from ED awake, alert  & orientated  X 3,  No Order, VSS - Blood pressure 143/56, pulse 86, temperature 98.2 F (36.8 C), temperature source Oral, resp. rate 18, height 5' 6.5" (1.689 m), weight 79.379 kg (175 lb), SpO2 96 %.RA, no c/o shortness of breath, no c/o chest pain, no distress noted.    IV site WDL:  with a transparent dsg that's clean dry and intact.  Allergies:   Allergies  Allergen Reactions  . Penicillins Rash  . Sulfonamide Derivatives Rash     Past Medical History  Diagnosis Date  . Paroxysmal supraventricular tachycardia (HCC)   . Unspecified asthma(493.90)   . Allergic rhinitis, cause unspecified     History:  obtained from patient  Pt orientation to unit, room and routine. Information packet given to patient and safety video watched.  Admission INP armband ID verified with patient, and in place. SR up x 2, fall risk assessment complete with Patient verbalizing understanding of risks associated with falls. Pt verbalizes an understanding of how to use the call bell and to call for help before getting out of bed.  Skin, see flowsheet   Will cont to monitor and assist as needed.  Elisha PonderFaucette, Eugenia Eldredge Nicole, RN 03/11/2016 9:51 PM

## 2016-03-11 NOTE — ED Notes (Signed)
Left leg wound leaking fluid and increased swelling.  Elevated leg on chair and reinforced wound that has bandaid over it that pt placed at home with 4x4 and wrapped in guaze.

## 2016-03-11 NOTE — Progress Notes (Addendum)
On call MD for Dr. Victorino DikeHewitt notified that patient was on no current antibiotic regimen.  She completed one time dose of vancomycin in the ED that ended at 20:10.  Pharm was consulted by this RN.  Pharmacist stated he would be able to continue a regimen if a note had stated what regimen was to be continued. Also there is no VTE order, however patient has ambulated to the restroom via one assist several times.  MD on call stated all concerns will be addressed when physician rounds early in am.  Vancomycin dose is currently sufficient.  Will continue to monitor patient.

## 2016-03-11 NOTE — ED Provider Notes (Signed)
75 year old female, she is not a diabetic, she presents with left lower extremity redness and swelling with a hot ankle. She had had surgery on this ankle 3 months ago by Dr. Victorino DikeHewitt after suffering an ankle fracture. Her scalp has started to come off and she is draining serosanguineous drainage. For the last 3 days she has had a progressive redness and swelling of this area. On exam is clear that the redness extends to the mid lower extremity between the knee and ankle all the way through the toes of the foot and the foot and ankle is significant swollen compared to the other side. There is no purulent drainage, she does not appear in distress and has no tachycardia. There is no fever.  D/w Ortho, Dr. Victorino DikeHewitt who reuqests holding orders and who will see her in the morning.  Medical screening examination/treatment/procedure(s) were conducted as a shared visit with non-physician practitioner(s) and myself.  I personally evaluated the patient during the encounter.  Clinical Impression:   Final diagnoses:  Cellulitis of left foot         Eber HongBrian Zayvier Caravello, MD 03/12/16 [phone]

## 2016-03-12 ENCOUNTER — Encounter (HOSPITAL_COMMUNITY): Payer: Self-pay | Admitting: General Practice

## 2016-03-12 ENCOUNTER — Encounter (HOSPITAL_COMMUNITY)
Admission: EM | Disposition: A | Discharge status: Home Health | Payer: Self-pay | Source: Home / Self Care | Attending: Orthopedic Surgery

## 2016-03-12 ENCOUNTER — Inpatient Hospital Stay (HOSPITAL_COMMUNITY): Payer: Medicare Other | Admitting: Certified Registered Nurse Anesthetist

## 2016-03-12 DIAGNOSIS — Y838 Other surgical procedures as the cause of abnormal reaction of the patient, or of later complication, without mention of misadventure at the time of the procedure: Secondary | ICD-10-CM | POA: Diagnosis not present

## 2016-03-12 DIAGNOSIS — T847XXA Infection and inflammatory reaction due to other internal orthopedic prosthetic devices, implants and grafts, initial encounter: Secondary | ICD-10-CM | POA: Insufficient documentation

## 2016-03-12 DIAGNOSIS — B9689 Other specified bacterial agents as the cause of diseases classified elsewhere: Secondary | ICD-10-CM | POA: Diagnosis not present

## 2016-03-12 DIAGNOSIS — M86172 Other acute osteomyelitis, left ankle and foot: Secondary | ICD-10-CM | POA: Diagnosis present

## 2016-03-12 DIAGNOSIS — I471 Supraventricular tachycardia: Secondary | ICD-10-CM | POA: Diagnosis present

## 2016-03-12 DIAGNOSIS — Z79899 Other long term (current) drug therapy: Secondary | ICD-10-CM | POA: Diagnosis not present

## 2016-03-12 DIAGNOSIS — T8469XA Infection and inflammatory reaction due to internal fixation device of other site, initial encounter: Secondary | ICD-10-CM | POA: Diagnosis present

## 2016-03-12 DIAGNOSIS — Z113 Encounter for screening for infections with a predominantly sexual mode of transmission: Secondary | ICD-10-CM | POA: Insufficient documentation

## 2016-03-12 DIAGNOSIS — G4700 Insomnia, unspecified: Secondary | ICD-10-CM | POA: Diagnosis present

## 2016-03-12 DIAGNOSIS — Y798 Miscellaneous orthopedic devices associated with adverse incidents, not elsewhere classified: Secondary | ICD-10-CM | POA: Diagnosis present

## 2016-03-12 DIAGNOSIS — T8469XD Infection and inflammatory reaction due to internal fixation device of other site, subsequent encounter: Secondary | ICD-10-CM | POA: Diagnosis not present

## 2016-03-12 DIAGNOSIS — K219 Gastro-esophageal reflux disease without esophagitis: Secondary | ICD-10-CM | POA: Diagnosis present

## 2016-03-12 DIAGNOSIS — T8149XA Infection following a procedure, other surgical site, initial encounter: Secondary | ICD-10-CM | POA: Diagnosis present

## 2016-03-12 DIAGNOSIS — M25572 Pain in left ankle and joints of left foot: Secondary | ICD-10-CM | POA: Diagnosis present

## 2016-03-12 DIAGNOSIS — L03116 Cellulitis of left lower limb: Secondary | ICD-10-CM | POA: Diagnosis present

## 2016-03-12 DIAGNOSIS — B9561 Methicillin susceptible Staphylococcus aureus infection as the cause of diseases classified elsewhere: Secondary | ICD-10-CM | POA: Diagnosis present

## 2016-03-12 DIAGNOSIS — J45909 Unspecified asthma, uncomplicated: Secondary | ICD-10-CM | POA: Diagnosis present

## 2016-03-12 HISTORY — PX: I & D EXTREMITY: SHX5045

## 2016-03-12 LAB — SURGICAL PCR SCREEN
MRSA, PCR: NEGATIVE
Staphylococcus aureus: POSITIVE — AB

## 2016-03-12 LAB — C-REACTIVE PROTEIN: CRP: 7.3 mg/dL — ABNORMAL HIGH (ref <1.0)

## 2016-03-12 LAB — SEDIMENTATION RATE: SED RATE: 78 mm/h — AB (ref 0–22)

## 2016-03-12 SURGERY — IRRIGATION AND DEBRIDEMENT EXTREMITY
Anesthesia: General

## 2016-03-12 MED ORDER — CHLORHEXIDINE GLUCONATE CLOTH 2 % EX PADS
6.0000 | MEDICATED_PAD | Freq: Every day | CUTANEOUS | Status: DC
  Administered 2016-03-12: 6 via TOPICAL

## 2016-03-12 MED ORDER — DOCUSATE SODIUM 100 MG PO CAPS
100.0000 mg | ORAL_CAPSULE | Freq: Two times a day (BID) | ORAL | Status: DC
  Administered 2016-03-12 – 2016-03-14 (×5): 100 mg via ORAL
  Filled 2016-03-12 (×5): qty 1

## 2016-03-12 MED ORDER — ACETAMINOPHEN 650 MG RE SUPP: 650.0000 mg | Freq: Four times a day (QID) | RECTAL | Status: DC | PRN

## 2016-03-12 MED ORDER — BUDESONIDE 0.25 MG/2ML IN SUSP
0.2500 mg | Freq: Two times a day (BID) | RESPIRATORY_TRACT | Status: DC
  Administered 2016-03-12 – 2016-03-14 (×5): 0.25 mg via RESPIRATORY_TRACT
  Filled 2016-03-12 (×5): qty 2

## 2016-03-12 MED ORDER — ACETAMINOPHEN 325 MG PO TABS
650.0000 mg | ORAL_TABLET | Freq: Four times a day (QID) | ORAL | Status: DC | PRN
  Administered 2016-03-12 – 2016-03-13 (×2): 650 mg via ORAL
  Filled 2016-03-12 (×2): qty 2

## 2016-03-12 MED ORDER — SODIUM CHLORIDE 0.9 % IR SOLN
Status: DC | PRN
  Administered 2016-03-12 (×2): 3000 mL

## 2016-03-12 MED ORDER — HYDROCODONE-ACETAMINOPHEN 5-325 MG PO TABS
1.0000 | ORAL_TABLET | ORAL | Status: DC | PRN
  Administered 2016-03-12 – 2016-03-13 (×4): 2 via ORAL
  Administered 2016-03-14 (×2): 1 via ORAL
  Filled 2016-03-12 (×2): qty 1
  Filled 2016-03-12 (×4): qty 2

## 2016-03-12 MED ORDER — PINDOLOL 5 MG PO TABS
5.0000 mg | ORAL_TABLET | Freq: Every day | ORAL | Status: DC
Start: 2016-03-12 — End: 2016-03-14
  Administered 2016-03-12 – 2016-03-14 (×3): 5 mg via ORAL
  Filled 2016-03-12 (×4): qty 1

## 2016-03-12 MED ORDER — CHLORHEXIDINE GLUCONATE 4 % EX LIQD
60.0000 mL | Freq: Once | CUTANEOUS | Status: AC
  Administered 2016-03-12: 4 via TOPICAL
  Filled 2016-03-12: qty 60

## 2016-03-12 MED ORDER — VANCOMYCIN HCL IN DEXTROSE 750-5 MG/150ML-% IV SOLN
750.0000 mg | Freq: Two times a day (BID) | INTRAVENOUS | Status: DC
  Administered 2016-03-12 – 2016-03-14 (×4): 750 mg via INTRAVENOUS
  Filled 2016-03-12 (×6): qty 150

## 2016-03-12 MED ORDER — PROPOFOL 10 MG/ML IV BOLUS
INTRAVENOUS | Status: DC | PRN
  Administered 2016-03-12: 150 mg via INTRAVENOUS

## 2016-03-12 MED ORDER — BACITRACIN ZINC 500 UNIT/GM EX OINT
TOPICAL_OINTMENT | CUTANEOUS | Status: AC
  Filled 2016-03-12: qty 28.35

## 2016-03-12 MED ORDER — FLUTICASONE PROPIONATE 50 MCG/ACT NA SUSP
2.0000 | Freq: Every evening | NASAL | Status: DC
  Administered 2016-03-12 – 2016-03-13 (×2): 2 via NASAL
  Filled 2016-03-12: qty 16

## 2016-03-12 MED ORDER — LACTATED RINGERS IV SOLN: INTRAVENOUS | Status: DC

## 2016-03-12 MED ORDER — CEFAZOLIN SODIUM-DEXTROSE 2-3 GM-% IV SOLR
INTRAVENOUS | Status: DC | PRN
  Administered 2016-03-12: 2 g via INTRAVENOUS

## 2016-03-12 MED ORDER — PROPOFOL 10 MG/ML IV BOLUS
INTRAVENOUS | Status: AC
  Filled 2016-03-12: qty 20

## 2016-03-12 MED ORDER — MIDAZOLAM HCL 2 MG/2ML IJ SOLN
INTRAMUSCULAR | Status: AC
  Filled 2016-03-12: qty 2

## 2016-03-12 MED ORDER — METOCLOPRAMIDE HCL 10 MG PO TABS: 5.0000 mg | ORAL_TABLET | Freq: Three times a day (TID) | ORAL | Status: DC | PRN

## 2016-03-12 MED ORDER — MUPIROCIN 2 % EX OINT
1.0000 "application " | TOPICAL_OINTMENT | Freq: Two times a day (BID) | CUTANEOUS | Status: DC
  Administered 2016-03-13 – 2016-03-14 (×3): 1 via NASAL
  Filled 2016-03-12 (×3): qty 22

## 2016-03-12 MED ORDER — MEPERIDINE HCL 25 MG/ML IJ SOLN: 6.2500 mg | INTRAMUSCULAR | Status: DC | PRN

## 2016-03-12 MED ORDER — ONDANSETRON HCL 4 MG/2ML IJ SOLN
4.0000 mg | Freq: Four times a day (QID) | INTRAMUSCULAR | Status: DC | PRN
  Administered 2016-03-12: 4 mg via INTRAVENOUS
  Filled 2016-03-12: qty 2

## 2016-03-12 MED ORDER — BUPIVACAINE-EPINEPHRINE 0.5% -1:200000 IJ SOLN
INTRAMUSCULAR | Status: DC | PRN
  Administered 2016-03-12: 30 mL

## 2016-03-12 MED ORDER — FENTANYL CITRATE (PF) 100 MCG/2ML IJ SOLN
INTRAMUSCULAR | Status: AC
  Administered 2016-03-12: 50 ug via INTRAVENOUS
  Filled 2016-03-12: qty 2

## 2016-03-12 MED ORDER — FENTANYL CITRATE (PF) 100 MCG/2ML IJ SOLN
INTRAMUSCULAR | Status: DC | PRN
  Administered 2016-03-12: 50 ug via INTRAVENOUS

## 2016-03-12 MED ORDER — ONDANSETRON HCL 4 MG/2ML IJ SOLN
INTRAMUSCULAR | Status: DC | PRN
  Administered 2016-03-12: 4 mg via INTRAVENOUS

## 2016-03-12 MED ORDER — LIDOCAINE HCL (CARDIAC) 20 MG/ML IV SOLN
INTRAVENOUS | Status: DC | PRN
  Administered 2016-03-12: 60 mg via INTRAVENOUS

## 2016-03-12 MED ORDER — POVIDONE-IODINE 10 % EX SWAB
2.0000 "application " | Freq: Once | CUTANEOUS | Status: AC
  Administered 2016-03-12: 2 via TOPICAL

## 2016-03-12 MED ORDER — ONDANSETRON HCL 4 MG/2ML IJ SOLN: 4.0000 mg | Freq: Four times a day (QID) | INTRAMUSCULAR | Status: DC | PRN

## 2016-03-12 MED ORDER — FENTANYL CITRATE (PF) 100 MCG/2ML IJ SOLN
25.0000 ug | INTRAMUSCULAR | Status: DC | PRN
  Administered 2016-03-12 (×2): 50 ug via INTRAVENOUS

## 2016-03-12 MED ORDER — SUVOREXANT 15 MG PO TABS: 1.0000 | ORAL_TABLET | Freq: Every day | ORAL | Status: DC

## 2016-03-12 MED ORDER — ALBUTEROL SULFATE (2.5 MG/3ML) 0.083% IN NEBU: 2.5000 mg | INHALATION_SOLUTION | Freq: Four times a day (QID) | RESPIRATORY_TRACT | Status: DC | PRN

## 2016-03-12 MED ORDER — ONDANSETRON HCL 4 MG PO TABS: 4.0000 mg | ORAL_TABLET | Freq: Four times a day (QID) | ORAL | Status: DC | PRN

## 2016-03-12 MED ORDER — PHENYLEPHRINE HCL 10 MG/ML IJ SOLN
INTRAMUSCULAR | Status: DC | PRN
  Administered 2016-03-12 (×2): 40 ug via INTRAVENOUS

## 2016-03-12 MED ORDER — SENNA 8.6 MG PO TABS
1.0000 | ORAL_TABLET | Freq: Two times a day (BID) | ORAL | Status: DC
Start: 2016-03-12 — End: 2016-03-14
  Administered 2016-03-12 – 2016-03-14 (×5): 8.6 mg via ORAL
  Filled 2016-03-12 (×5): qty 1

## 2016-03-12 MED ORDER — 0.9 % SODIUM CHLORIDE (POUR BTL) OPTIME
TOPICAL | Status: DC | PRN
  Administered 2016-03-12: 1000 mL

## 2016-03-12 MED ORDER — MORPHINE SULFATE (PF) 2 MG/ML IV SOLN
1.0000 mg | INTRAVENOUS | Status: DC | PRN
Start: 2016-03-12 — End: 2016-03-14

## 2016-03-12 MED ORDER — ACETAMINOPHEN 325 MG PO TABS
650.0000 mg | ORAL_TABLET | Freq: Four times a day (QID) | ORAL | Status: DC | PRN
  Administered 2016-03-12: 650 mg via ORAL
  Filled 2016-03-12: qty 2

## 2016-03-12 MED ORDER — MIDAZOLAM HCL 5 MG/5ML IJ SOLN
INTRAMUSCULAR | Status: DC | PRN
  Administered 2016-03-12: 1 mg via INTRAVENOUS

## 2016-03-12 MED ORDER — DEXTROSE 5 % IV SOLN
2.0000 g | Freq: Two times a day (BID) | INTRAVENOUS | Status: DC
  Administered 2016-03-12: 2 g via INTRAVENOUS
  Filled 2016-03-12 (×3): qty 2

## 2016-03-12 MED ORDER — BUPIVACAINE-EPINEPHRINE (PF) 0.5% -1:200000 IJ SOLN
INTRAMUSCULAR | Status: AC
  Filled 2016-03-12: qty 30

## 2016-03-12 MED ORDER — LACTATED RINGERS IV SOLN
INTRAVENOUS | Status: DC
  Administered 2016-03-12: 12:00:00 via INTRAVENOUS

## 2016-03-12 MED ORDER — KCL IN DEXTROSE-NACL 20-5-0.45 MEQ/L-%-% IV SOLN
INTRAVENOUS | Status: DC
  Filled 2016-03-12: qty 1000

## 2016-03-12 MED ORDER — METOCLOPRAMIDE HCL 5 MG/ML IJ SOLN
5.0000 mg | Freq: Three times a day (TID) | INTRAMUSCULAR | Status: DC | PRN
  Administered 2016-03-13: 10 mg via INTRAVENOUS
  Filled 2016-03-12: qty 2

## 2016-03-12 MED ORDER — PANTOPRAZOLE SODIUM 40 MG PO TBEC
40.0000 mg | DELAYED_RELEASE_TABLET | Freq: Every day | ORAL | Status: DC
  Administered 2016-03-12 – 2016-03-14 (×3): 40 mg via ORAL
  Filled 2016-03-12 (×3): qty 1

## 2016-03-12 MED ORDER — FENTANYL CITRATE (PF) 250 MCG/5ML IJ SOLN
INTRAMUSCULAR | Status: AC
  Filled 2016-03-12: qty 5

## 2016-03-12 MED ORDER — ATORVASTATIN CALCIUM 10 MG PO TABS
10.0000 mg | ORAL_TABLET | Freq: Every day | ORAL | Status: DC
  Administered 2016-03-12 – 2016-03-14 (×3): 10 mg via ORAL
  Filled 2016-03-12 (×4): qty 1

## 2016-03-12 SURGICAL SUPPLY — 58 items
BANDAGE ELASTIC 4 VELCRO ST LF (GAUZE/BANDAGES/DRESSINGS) ×3 IMPLANT
BANDAGE ESMARK 6X9 LF (GAUZE/BANDAGES/DRESSINGS) ×1 IMPLANT
BLADE SURG 10 STRL SS (BLADE) ×3 IMPLANT
BNDG CMPR 9X6 STRL LF SNTH (GAUZE/BANDAGES/DRESSINGS) ×1
BNDG COHESIVE 4X5 TAN STRL (GAUZE/BANDAGES/DRESSINGS) ×3 IMPLANT
BNDG COHESIVE 6X5 TAN STRL LF (GAUZE/BANDAGES/DRESSINGS) ×3 IMPLANT
BNDG CONFORM 3 STRL LF (GAUZE/BANDAGES/DRESSINGS) ×3 IMPLANT
BNDG ESMARK 6X9 LF (GAUZE/BANDAGES/DRESSINGS) ×3
CANISTER SUCT 3000ML PPV (MISCELLANEOUS) ×3 IMPLANT
CHLORAPREP W/TINT 26ML (MISCELLANEOUS) ×3 IMPLANT
COVER SURGICAL LIGHT HANDLE (MISCELLANEOUS) ×3 IMPLANT
CUFF TOURNIQUET SINGLE 34IN LL (TOURNIQUET CUFF) ×3 IMPLANT
CUFF TOURNIQUET SINGLE 44IN (TOURNIQUET CUFF) IMPLANT
DRAIN CHANNEL 10M FLAT 3/4 FLT (DRAIN) IMPLANT
DRAIN PENROSE 1/2X12 LTX STRL (WOUND CARE) IMPLANT
DRAPE U-SHAPE 47X51 STRL (DRAPES) ×3 IMPLANT
DRSG ADAPTIC 3X8 NADH LF (GAUZE/BANDAGES/DRESSINGS) ×3 IMPLANT
DRSG MEPITEL 3X4 ME34 (GAUZE/BANDAGES/DRESSINGS) ×2 IMPLANT
DRSG PAD ABDOMINAL 8X10 ST (GAUZE/BANDAGES/DRESSINGS) IMPLANT
ELECT REM PT RETURN 9FT ADLT (ELECTROSURGICAL) ×3
ELECTRODE REM PT RTRN 9FT ADLT (ELECTROSURGICAL) ×1 IMPLANT
EVACUATOR SILICONE 100CC (DRAIN) IMPLANT
GAUZE SPONGE 4X4 12PLY STRL (GAUZE/BANDAGES/DRESSINGS) IMPLANT
GLOVE BIO SURGEON STRL SZ8 (GLOVE) ×3 IMPLANT
GLOVE BIOGEL PI IND STRL 8 (GLOVE) ×2 IMPLANT
GLOVE BIOGEL PI INDICATOR 8 (GLOVE) ×4
GLOVE ECLIPSE 7.5 STRL STRAW (GLOVE) ×3 IMPLANT
GOWN STRL REUS W/ TWL XL LVL3 (GOWN DISPOSABLE) ×2 IMPLANT
GOWN STRL REUS W/TWL XL LVL3 (GOWN DISPOSABLE) ×6
KIT BASIN OR (CUSTOM PROCEDURE TRAY) ×3 IMPLANT
KIT ROOM TURNOVER OR (KITS) ×3 IMPLANT
NS IRRIG 1000ML POUR BTL (IV SOLUTION) ×3 IMPLANT
PACK ORTHO EXTREMITY (CUSTOM PROCEDURE TRAY) ×3 IMPLANT
PAD ARMBOARD 7.5X6 YLW CONV (MISCELLANEOUS) ×6 IMPLANT
PAD CAST 4YDX4 CTTN HI CHSV (CAST SUPPLIES) ×1 IMPLANT
PADDING CAST ABS 4INX4YD NS (CAST SUPPLIES) ×2
PADDING CAST ABS COTTON 4X4 ST (CAST SUPPLIES) IMPLANT
PADDING CAST COTTON 4X4 STRL (CAST SUPPLIES) ×3
SET CYSTO W/LG BORE CLAMP LF (SET/KITS/TRAYS/PACK) ×3 IMPLANT
SOAP 2 % CHG 4 OZ (WOUND CARE) ×3 IMPLANT
SPONGE GAUZE 4X4 12PLY STER LF (GAUZE/BANDAGES/DRESSINGS) ×2 IMPLANT
SPONGE LAP 4X18 X RAY DECT (DISPOSABLE) ×3 IMPLANT
STAPLER VISISTAT 35W (STAPLE) IMPLANT
SUCTION FRAZIER HANDLE 10FR (MISCELLANEOUS) ×2
SUCTION TUBE FRAZIER 10FR DISP (MISCELLANEOUS) ×1 IMPLANT
SUT ETHILON 3 0 FSL (SUTURE) IMPLANT
SUT PROLENE 3 0 PS 2 (SUTURE) ×3 IMPLANT
SUT VIC AB 2-0 CT1 27 (SUTURE) ×6
SUT VIC AB 2-0 CT1 TAPERPNT 27 (SUTURE) ×2 IMPLANT
SUT VIC AB 3-0 PS2 18 (SUTURE) ×3
SUT VIC AB 3-0 PS2 18XBRD (SUTURE) ×1 IMPLANT
TOWEL OR 17X24 6PK STRL BLUE (TOWEL DISPOSABLE) ×3 IMPLANT
TOWEL OR 17X26 10 PK STRL BLUE (TOWEL DISPOSABLE) ×3 IMPLANT
TUBE CONNECTING 12'X1/4 (SUCTIONS) ×1
TUBE CONNECTING 12X1/4 (SUCTIONS) ×2 IMPLANT
TUBING CYSTO DISP (UROLOGICAL SUPPLIES) ×3 IMPLANT
WATER STERILE IRR 1000ML POUR (IV SOLUTION) ×3 IMPLANT
YANKAUER SUCT BULB TIP NO VENT (SUCTIONS) ×3 IMPLANT

## 2016-03-12 NOTE — Anesthesia Preprocedure Evaluation (Addendum)
Anesthesia Evaluation  Patient identified by MRN, date of birth, ID band Patient awake    Reviewed: Allergy & Precautions, NPO status , Patient's Chart, lab work & pertinent test results  Airway Mallampati: I  TM Distance: >3 FB Neck ROM: Full    Dental  (+) Teeth Intact, Dental Advisory Given   Pulmonary asthma ,    breath sounds clear to auscultation       Cardiovascular negative cardio ROS  + dysrhythmias Supra Ventricular Tachycardia  Rhythm:Regular Rate:Normal     Neuro/Psych negative neurological ROS  negative psych ROS   GI/Hepatic Neg liver ROS, GERD  Medicated and Controlled,  Endo/Other  negative endocrine ROS  Renal/GU negative Renal ROS  negative genitourinary   Musculoskeletal negative musculoskeletal ROS (+)   Abdominal   Peds negative pediatric ROS (+)  Hematology negative hematology ROS (+)   Anesthesia Other Findings   Reproductive/Obstetrics negative OB ROS                           Lab Results  Component Value Date   WBC 11.5* 03/11/2016   HGB 10.7* 03/11/2016   HCT 32.8* 03/11/2016   MCV 89.9 03/11/2016   PLT 256 03/11/2016   Lab Results  Component Value Date   CREATININE 0.91 03/11/2016   BUN 11 03/11/2016   NA 137 03/11/2016   K 3.9 03/11/2016   CL 103 03/11/2016   CO2 20* 03/11/2016   No results found for: INR, PROTIME   Anesthesia Physical Anesthesia Plan  ASA: II  Anesthesia Plan: General   Post-op Pain Management:    Induction: Intravenous  Airway Management Planned: LMA  Additional Equipment:   Intra-op Plan:   Post-operative Plan: Extubation in OR  Informed Consent: I have reviewed the patients History and Physical, chart, labs and discussed the procedure including the risks, benefits and alternatives for the proposed anesthesia with the patient or authorized representative who has indicated his/her understanding and acceptance.    Dental advisory given  Plan Discussed with: CRNA, Anesthesiologist and Surgeon  Anesthesia Plan Comments:        Anesthesia Quick Evaluation

## 2016-03-12 NOTE — Anesthesia Procedure Notes (Signed)
Procedure Name: LMA Insertion Date/Time: 03/12/2016 12:42 PM Performed by: Rogelia BogaMUELLER, Taeko Schaffer P Pre-anesthesia Checklist: Patient identified, Emergency Drugs available, Suction available, Patient being monitored and Timeout performed Patient Re-evaluated:Patient Re-evaluated prior to inductionOxygen Delivery Method: Circle system utilized Preoxygenation: Pre-oxygenation with 100% oxygen Intubation Type: IV induction LMA: LMA inserted LMA Size: 4.0 Number of attempts: 1 Placement Confirmation: positive ETCO2,  CO2 detector and breath sounds checked- equal and bilateral Tube secured with: Tape Dental Injury: Teeth and Oropharynx as per pre-operative assessment

## 2016-03-12 NOTE — Anesthesia Postprocedure Evaluation (Signed)
Anesthesia Post Note  Patient: Cathy [name]  Procedure(s) Performed: Procedure(s) (LRB): IRRIGATION AND DEBRIDEMENT EXTREMITY (N/A)  Patient location during evaluation: PACU Anesthesia Type: General Level of consciousness: awake and alert Pain management: pain level controlled Vital Signs Assessment: post-procedure vital signs reviewed and stable Respiratory status: spontaneous breathing, nonlabored ventilation, respiratory function stable and patient connected to nasal cannula oxygen Cardiovascular status: blood pressure returned to baseline and stable Postop Assessment: no signs of nausea or vomiting Anesthetic complications: no    Last Vitals:  Filed Vitals:   03/12/16 1430 03/12/16 1501  BP: 125/56 125/60  Pulse: 66 67  Temp: 36.5 C 36.5 C  Resp: 18 18    Last Pain:  Filed Vitals:   03/12/16 1502  PainSc: 5                  Shelton SilvasKevin D Messiah Ahr

## 2016-03-12 NOTE — Brief Op Note (Signed)
03/11/2016 - 03/12/2016  1:30 PM  PATIENT:  Cathy [name]  75 y.o. female  PRE-OPERATIVE DIAGNOSIS:  Left ankle infection s/p ORIF  POST-OPERATIVE DIAGNOSIS:  same  Procedure(s): 1.  Left ankle wound irrigation and debridement   2.  Left ankle removal of deep implants  SURGEON:  Toni ArthursJohn Alakai Macbride, MD  ASSISTANT: n/a  ANESTHESIA:   General  EBL:  minimal   TOURNIQUET:   Total Tourniquet Time Documented: Thigh (Left) - 23 minutes Total: Thigh (Left) - 23 minutes  SPECIMEN:  Deep tissue to micro for aerobic and anaerobic culture  COMPLICATIONS:  None apparent  DISPOSITION:  Extubated, awake and stable to recovery.  DICTATION [id number]

## 2016-03-12 NOTE — Transfer of Care (Signed)
Immediate Anesthesia Transfer of Care Note  Patient: Cathy [name]  Procedure(s) Performed: Procedure(s): IRRIGATION AND DEBRIDEMENT EXTREMITY (N/A)  Patient Location: PACU  Anesthesia Type:General  Level of Consciousness: awake, alert , oriented and patient cooperative  Airway & Oxygen Therapy: Patient Spontanous Breathing and Patient connected to nasal cannula oxygen  Post-op Assessment: Report given to RN, Post -op Vital signs reviewed and stable and Patient moving all extremities X 4  Post vital signs: Reviewed and stable  Last Vitals:  Filed Vitals:   03/12/16 0549 03/12/16 1339  BP: 116/44   Pulse: 94   Temp: 37.5 C 36.6 C  Resp: 18     Complications: No apparent anesthesia complications

## 2016-03-12 NOTE — H&P (Signed)
Cathy [name] is an 75 y.o. female.   Chief Complaint: left ankle pain HPI: 75 y/o female with left ankle fracture in January.  She underwent ORIF at that time.  She's been weight bearing in a boot for the last week and in a regular shoe in PT.  SHe started having drainage from the left ankle wound last week, and it started hurting Sunday.  She presented to the ER yesterday.  She c/o chills but denies f/n/v.  NO change in appetite.  Past Medical History  Diagnosis Date  . Paroxysmal supraventricular tachycardia ([place])   . Unspecified asthma(493.90)   . Allergic rhinitis, cause unspecified     Past Surgical History  Procedure Laterality Date  . Toe surgery    . Appendectomy    . Total abdominal hysterectomy    . Bilateral salpingoophorectomy    . Tonsillectomy    . Squamous cell cancer face    . Vocal cord nodules      History reviewed. No pertinent family history. Social History:  reports that she has never smoked. She has never used smokeless tobacco. She reports that she does not drink alcohol or use illicit drugs.  Allergies:  Allergies  Allergen Reactions  . Penicillins Rash  . Sulfonamide Derivatives Rash    Medications Prior to Admission  Medication Sig Dispense Refill  . albuterol (PROVENTIL HFA) 108 (90 BASE) MCG/ACT inhaler Inhale 2 puffs into the lungs every 6 (six) hours as needed for wheezing or shortness of breath. 1 Inhaler prn  . alendronate (FOSAMAX) 35 MG tablet Take 1 tablet by mouth Once a week.    Marland Kitchen atorvastatin (LIPITOR) 10 MG tablet Take 10 mg by mouth daily.    . beclomethasone (QVAR) 40 MCG/ACT inhaler Inhale 2 puffs into the lungs 2 (two) times daily. Rinse mouth 3 Inhaler 3  . Biotin 1000 MCG tablet Take 1,000 mcg by mouth daily.    . Calcium Carbonate (CALTRATE 600) 1500 MG TABS Take by mouth daily.      Marland Kitchen etodolac (LODINE) 500 MG tablet Take 1 tablet by mouth 2 (two) times daily.    . fluticasone (FLONASE) 50 MCG/ACT nasal spray Place 2 sprays  into the nose every evening.    Marland Kitchen KRILL OIL 1000 MG CAPS Take 1 capsule by mouth daily.    . Magnesium 500 MG TABS Take 1,000 mg by mouth daily.    . Multiple Vitamin (MULTIVITAMIN) tablet Take 1 tablet by mouth daily.      . pantoprazole (PROTONIX) 40 MG tablet Take 40 mg by mouth daily.    . pindolol (VISKEN) 5 MG tablet Take 5 mg by mouth daily.      . Suvorexant (BELSOMRA) 15 MG TABS Take 1 capsule by mouth at bedtime. 30 tablet 0  . Vitamin D, Ergocalciferol, (DRISDOL) 50000 units CAPS capsule Take 1 capsule by mouth once a week.      Results for orders placed or performed during the hospital encounter of 03/11/16 (from the past 48 hour(s))  CBC     Status: Abnormal   Collection Time: 03/11/16  5:35 PM  Result Value Ref Range   WBC 11.5 (H) 4.0 - 10.5 K/uL   RBC 3.65 (L) 3.87 - 5.11 MIL/uL   Hemoglobin 10.7 (L) 12.0 - 15.0 g/dL   HCT 32.8 (L) 36.0 - 46.0 %   MCV 89.9 78.0 - 100.0 fL   MCH 29.3 26.0 - 34.0 pg   MCHC 32.6 30.0 - 36.0 g/dL  RDW 13.7 11.5 - 15.5 %   Platelets 256 150 - 400 K/uL  Basic metabolic panel     Status: Abnormal   Collection Time: 03/11/16  5:35 PM  Result Value Ref Range   Sodium 137 135 - 145 mmol/L   Potassium 3.9 3.5 - 5.1 mmol/L   Chloride 103 101 - 111 mmol/L   CO2 20 (L) 22 - 32 mmol/L   Glucose, Bld 108 (H) 65 - 99 mg/dL   BUN 11 6 - 20 mg/dL   Creatinine, Ser 0.91 0.44 - 1.00 mg/dL   Calcium 8.4 (L) 8.9 - 10.3 mg/dL   GFR calc non Af Amer >60 >60 mL/min   GFR calc Af Amer >60 >60 mL/min    Comment: (NOTE) The eGFR has been calculated using the CKD EPI equation. This calculation has not been validated in all clinical situations. eGFR's persistently <60 mL/min signify possible Chronic Kidney Disease.    Anion gap 14 5 - 15   Dg Ankle Complete Left  03/11/2016  CLINICAL DATA:  LEFT ankle surgery 1 month ago, pain, redness and swelling of LEFT ankle for 2 days, cellulitis, no known injury EXAM: LEFT ANKLE COMPLETE - 3+ VIEW COMPARISON:   None FINDINGS: Two cannulated screws at medial malleolus post ORIF. Lateral plate and 7 screws at distal fibula post ORIF. Hardware intact without surrounding lucency. Ankle mortise intact. Bones appear slightly demineralized. No acute fracture, dislocation, or bone destruction. Mild regional soft tissue swelling. IMPRESSION: Post bimalleolar ORIF. No acute abnormalities. Electronically Signed   By: Lavonia Dana M.D.   On: 03/11/2016 17:29    ROS as above an o/w neg.  Blood pressure 116/44, pulse 94, temperature 99.5 F (37.5 C), temperature source Oral, resp. rate 18, height '5\' 6"'  (1.676 m), weight 80.7 kg (177 lb 14.6 oz), SpO2 97 %. Physical Exam  wn wd woman in nad.  A and Ox 4.  Mood and affect normal.  EOMI.  resp unlabored.  L ankle with lateral incision that is healed over proximally but the distal 1 cm is open.  No visible metal.  There is purulent drainage and ttp along the entire incision.  Erythema is seen around the wound.  No lymphadenopathy.  5/5 strength in PF adn DF of the ankle and toes.  2+ dp and pt pulses.  Feels LT normally throughout the foot.  Assessment/Plan L ankle post op infection - NPO.  To OR this afternoon for I and D.  Hold abx.  Continue WBAT for now.  Wylene Simmer, MD 03/12/2016, 6:46 AM

## 2016-03-12 NOTE — Progress Notes (Signed)
ANTIBIOTIC CONSULT NOTE - INITIAL  Pharmacy Consult for Cefepime and Vancomycin Indication: wound infection  Allergies  Allergen Reactions  . Penicillins Rash  . Sulfonamide Derivatives Rash    Patient Measurements: Height:  (167.6 cm) Weight: 177 lb 14.6 oz (80.7 kg) IBW/kg (Calculated) : 59.3   Vital Signs: Temp: 97.6 F (36.4 C) (04/05 1417) Temp Source: Oral (04/05 1417) BP: 125/61 mmHg (04/05 1417) Pulse Rate: 68 (04/05 1417) Intake/Output from previous day: 04/04 0701 - 04/05 0700 In: 200 [IV Piggyback:200] Out: -  Intake/Output from this shift: Total I/O In: 800 [I.V.:800] Out: -   Labs:  Recent Labs  03/11/16 1735  WBC 11.5*  HGB 10.7*  PLT 256  CREATININE 0.91   Estimated Creatinine Clearance: 59 mL/min (by C-G formula based on Cr of 0.91). No results for input(s): VANCOTROUGH, VANCOPEAK, VANCORANDOM, GENTTROUGH, GENTPEAK, GENTRANDOM, TOBRATROUGH, TOBRAPEAK, TOBRARND, AMIKACINPEAK, AMIKACINTROU, AMIKACIN in the last 72 hours.   Microbiology: Recent Results (from the past 720 hour(s))  Wound culture     Status: None (Preliminary result)   Collection Time: 03/11/16  4:57 PM  Result Value Ref Range Status   Specimen Description WOUND LEFT ANKLE  Final   Special Requests Normal  Final   Gram Stain   Final    FEW WBC PRESENT, PREDOMINANTLY PMN NO SQUAMOUS EPITHELIAL CELLS SEEN MODERATE GRAM POSITIVE COCCI IN PAIRS Performed at [facility]    Culture PENDING  Incomplete   Report Status PENDING  Incomplete  Surgical pcr screen     Status: Abnormal   Collection Time: 03/12/16 10:34 AM  Result Value Ref Range Status   MRSA, PCR NEGATIVE NEGATIVE Final   Staphylococcus aureus POSITIVE (A) NEGATIVE Final    Comment:        The Xpert SA Assay (FDA approved for NASAL specimens in patients over 31 years of age), is one component of a comprehensive surveillance program.  Test performance has been validated by [facility] for patients  greater than or equal to 28 year old. It is not intended to diagnose infection nor to guide or monitor treatment.     Medical History: Past Medical History  Diagnosis Date  . Paroxysmal supraventricular tachycardia ([facility])   . Unspecified asthma(493.90)   . Allergic rhinitis, cause unspecified   . Wound infection after surgery 03/2016    Medications:  Prescriptions prior to admission  Medication Sig Dispense Refill Last Dose  . albuterol (PROVENTIL HFA) 108 (90 BASE) MCG/ACT inhaler Inhale 2 puffs into the lungs every 6 (six) hours as needed for wheezing or shortness of breath. 1 Inhaler prn Past Month at Unknown time  . alendronate (FOSAMAX) 35 MG tablet Take 1 tablet by mouth Once a week.   Past Week at Unknown time  . atorvastatin (LIPITOR) 10 MG tablet Take 10 mg by mouth daily.   03/10/2016 at Unknown time  . beclomethasone (QVAR) 40 MCG/ACT inhaler Inhale 2 puffs into the lungs 2 (two) times daily. Rinse mouth 3 Inhaler 3 03/11/2016 at Unknown time  . Biotin 1000 MCG tablet Take 1,000 mcg by mouth daily.   03/10/2016 at Unknown time  . Calcium Carbonate (CALTRATE 600) 1500 MG TABS Take by mouth daily.     03/10/2016 at Unknown time  . etodolac (LODINE) 500 MG tablet Take 1 tablet by mouth 2 (two) times daily.   03/10/2016 at Unknown time  . fluticasone (FLONASE) 50 MCG/ACT nasal spray Place 2 sprays into the nose every evening.   03/10/2016 at  Unknown time  . KRILL OIL 1000 MG CAPS Take 1 capsule by mouth daily.   03/10/2016 at Unknown time  . Magnesium 500 MG TABS Take 1,000 mg by mouth daily.   03/10/2016 at Unknown time  . Multiple Vitamin (MULTIVITAMIN) tablet Take 1 tablet by mouth daily.     03/10/2016 at Unknown time  . pantoprazole (PROTONIX) 40 MG tablet Take 40 mg by mouth daily.   03/10/2016 at Unknown time  . pindolol (VISKEN) 5 MG tablet Take 5 mg by mouth daily.     03/10/2016 at 2200  . Suvorexant (BELSOMRA) 15 MG TABS Take 1 capsule by mouth at bedtime. 30 tablet 0 03/10/2016 at Unknown  time  . Vitamin D, Ergocalciferol, (DRISDOL) 50000 units CAPS capsule Take 1 capsule by mouth once a week.   Past Week at Unknown time   Scheduled:  . atorvastatin  10 mg Oral Daily  . budesonide (PULMICORT) nebulizer solution  0.25 mg Nebulization BID  . docusate sodium  100 mg Oral BID  . fluticasone  2 spray Each Nare QPM  . pantoprazole  40 mg Oral Daily  . pindolol  5 mg Oral Daily  . senna  1 tablet Oral BID  . Suvorexant  1 capsule Oral QHS   Assessment: 75 y.o female with left ankle infection in 12/2015 and s/p ORIF at that time 12/2015. She started having drainage from the left ankle wound last week, and it started hurting on 03/09/16. [facility].   Now s/p I&D left ankle wound and removal of deep implants on 03/12/16.   SCr 0.91 , CrCl  ~50 ml/min WBC 11.5K , Afebrile Received Vancomycin 1gm IV x2 last night @ 18:47 and preop Cefazolin 1gm today @13 :11. PCN allergy noted. Received cefazolin today w/o adverse effect reported.  Goal of Therapy:  Vancomycin trough level 15-20 mcg/ml  Plan:  Vancomycin 750 mg IV q12h Cefepime 2 g IV q12h Monitor clinical status, renal function, culture results if done and vancomycin trough at steady state if needed.  Thank you for allowing pharmacy to be part of this patients care team.  Noah Delaineuth Sumner Kirchman, RPh Clinical Pharmacist Pager: [phone] 03/12/2016,2:22 PM

## 2016-03-12 NOTE — Consult Note (Signed)
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 Doran Durand   HPI: Cathy [name] is an 75 y.o. female with left ankle fracture that required ORIF in January. She began developing purulent drainage from the left ankle wound one week ago. She presented to ED yesterday. Unfortunately she received IV antibiotics in form of IV vancomycin at 1634 and 2010 on the 4th prior to her going to the OR. She underwent 1. Left ankle wound irrigation and debridement 2. Left ankle removal of deep implants from lateral ankle. Deep cultures taken.   Past Medical History  Diagnosis Date  . Paroxysmal supraventricular tachycardia ([place])   . Unspecified asthma(493.90)   . Allergic rhinitis, cause unspecified   . Wound infection after surgery 03/2016    Past Surgical History  Procedure Laterality Date  . Toe surgery    . Appendectomy    . Total abdominal hysterectomy    . Bilateral salpingoophorectomy    . Tonsillectomy    . Squamous cell cancer face    . Vocal cord nodules    . Ankle fracture surgery Left 12/2015    Social History:  reports that she has never smoked. She has never used smokeless tobacco. She reports that she does not drink alcohol or use illicit drugs.   History reviewed. No pertinent family history.  Allergies  Allergen Reactions  . Penicillins Rash  . Sulfonamide Derivatives Rash     Medications: I have reviewed patients current medications as documented in Epic Anti-infectives    Start     Dose/Rate Route Frequency Ordered Stop   03/12/16 1700  ceFEPIme (MAXIPIME) 2 g in dextrose 5 % 50 mL IVPB     2 g 100 mL/hr over 30 Minutes Intravenous Every 12 hours 03/12/16 1434     03/12/16 1530  vancomycin (VANCOCIN) IVPB 750 mg/150 ml premix     750 mg 150 mL/hr over 60 Minutes Intravenous Every 12 hours 03/12/16 1434     03/11/16 1845   vancomycin (VANCOCIN) IVPB 1000 mg/200 mL premix     1,000 mg 200 mL/hr over 60 Minutes Intravenous  Once 03/11/16 1838 03/11/16 2010         ROS: as in HPI otherwise remainder of 12 point Review of Systems is negative  Blood pressure 118/45, pulse 76, temperature 97.7 F (36.5 C), temperature source Oral, resp. rate 18, height '5\' 6"'  (1.676 m), weight 177 lb 14.6 oz (80.7 kg), SpO2 95 %. General: Alert and awake, oriented x3, not in any acute distress. HEENT: anicteric sclera,  EOMI, oropharynx clear and without exudate Cardiovascular: regular rate, normal r, Pulmonary:  no wheezing, resp distress Gastrointestinal: soft nondistended, normal bowel sounds, Musculoskeletal, skin soft tissue: ankle wrapped Neuro: nonfocal, strength and sensation intact   Results for orders placed or performed during the hospital encounter of 03/11/16 (from the past 48 hour(s))  Wound culture     Status: None (Preliminary result)   Collection Time: 03/11/16  4:57 PM  Result Value Ref Range   Specimen Description WOUND LEFT ANKLE    Special Requests Normal    Gram Stain      FEW WBC PRESENT, PREDOMINANTLY PMN NO SQUAMOUS EPITHELIAL CELLS SEEN MODERATE GRAM POSITIVE COCCI IN PAIRS Performed at [facility]    Culture PENDING    Report Status PENDING   CBC     Status: Abnormal  Collection Time: 03/11/16  5:35 PM  Result Value Ref Range   WBC 11.5 (H) 4.0 - 10.5 K/uL   RBC 3.65 (L) 3.87 - 5.11 MIL/uL   Hemoglobin 10.7 (L) 12.0 - 15.0 g/dL   HCT 32.8 (L) 36.0 - 46.0 %   MCV 89.9 78.0 - 100.0 fL   MCH 29.3 26.0 - 34.0 pg   MCHC 32.6 30.0 - 36.0 g/dL   RDW 13.7 11.5 - 15.5 %   Platelets 256 150 - 400 K/uL  Basic metabolic panel     Status: Abnormal   Collection Time: 03/11/16  5:35 PM  Result Value Ref Range   Sodium 137 135 - 145 mmol/L   Potassium 3.9 3.5 - 5.1 mmol/L   Chloride 103 101 - 111 mmol/L   CO2 20 (L) 22 - 32 mmol/L   Glucose, Bld 108 (H) 65 - 99 mg/dL   BUN 11 6 -  20 mg/dL   Creatinine, Ser 0.91 0.44 - 1.00 mg/dL   Calcium 8.4 (L) 8.9 - 10.3 mg/dL   GFR calc non Af Amer >60 >60 mL/min   GFR calc Af Amer >60 >60 mL/min    Comment: (NOTE) The eGFR has been calculated using the CKD EPI equation. This calculation has not been validated in all clinical situations. eGFR's persistently <60 mL/min signify possible Chronic Kidney Disease.    Anion gap 14 5 - 15  Surgical pcr screen     Status: Abnormal   Collection Time: 03/12/16 10:34 AM  Result Value Ref Range   MRSA, PCR NEGATIVE NEGATIVE   Staphylococcus aureus POSITIVE (A) NEGATIVE    Comment:        The Xpert SA Assay (FDA approved for NASAL specimens in patients over 31 years of age), is one component of a comprehensive surveillance program.  Test performance has been validated by [facility] for patients greater than or equal to 23 year old. It is not intended to diagnose infection nor to guide or monitor treatment.   Sedimentation rate     Status: Abnormal   Collection Time: 03/12/16  3:09 PM  Result Value Ref Range   Sed Rate 78 (H) 0 - 22 mm/hr   '@BRIEFLABTABLE' (sdes,specrequest,cult,reptstatus)   ) Recent Results (from the past 720 hour(s))  Wound culture     Status: None (Preliminary result)   Collection Time: 03/11/16  4:57 PM  Result Value Ref Range Status   Specimen Description WOUND LEFT ANKLE  Final   Special Requests Normal  Final   Gram Stain   Final    FEW WBC PRESENT, PREDOMINANTLY PMN NO SQUAMOUS EPITHELIAL CELLS SEEN MODERATE GRAM POSITIVE COCCI IN PAIRS Performed at [facility]    Culture PENDING  Incomplete   Report Status PENDING  Incomplete  Surgical pcr screen     Status: Abnormal   Collection Time: 03/12/16 10:34 AM  Result Value Ref Range Status   MRSA, PCR NEGATIVE NEGATIVE Final   Staphylococcus aureus POSITIVE (A) NEGATIVE Final    Comment:        The Xpert SA Assay (FDA approved for NASAL specimens in patients over 21 years of  age), is one component of a comprehensive surveillance program.  Test performance has been validated by [facility] for patients greater than or equal to 21 year old. It is not intended to diagnose infection nor to guide or monitor treatment.      Impression/Recommendation  Active Problems:   Cellulitis   Wound infection after surgery  Malaia Buchta is a 75 y.o. 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

## 2016-03-13 ENCOUNTER — Encounter (HOSPITAL_COMMUNITY): Payer: Self-pay | Admitting: Orthopedic Surgery

## 2016-03-13 DIAGNOSIS — A4901 Methicillin susceptible Staphylococcus aureus infection, unspecified site: Secondary | ICD-10-CM | POA: Insufficient documentation

## 2016-03-13 DIAGNOSIS — B9561 Methicillin susceptible Staphylococcus aureus infection as the cause of diseases classified elsewhere: Secondary | ICD-10-CM

## 2016-03-13 LAB — BASIC METABOLIC PANEL
ANION GAP: 9 (ref 5–15)
BUN: 13 mg/dL (ref 6–20)
CALCIUM: 8.5 mg/dL — AB (ref 8.9–10.3)
CO2: 25 mmol/L (ref 22–32)
CREATININE: 0.9 mg/dL (ref 0.44–1.00)
Chloride: 104 mmol/L (ref 101–111)
GLUCOSE: 131 mg/dL — AB (ref 65–99)
Potassium: 4.1 mmol/L (ref 3.5–5.1)
Sodium: 138 mmol/L (ref 135–145)

## 2016-03-13 LAB — C-REACTIVE PROTEIN: CRP: 5.5 mg/dL — ABNORMAL HIGH (ref <1.0)

## 2016-03-13 LAB — HIV ANTIBODY (ROUTINE TESTING W REFLEX): HIV Screen 4th Generation wRfx: NONREACTIVE

## 2016-03-13 MED ORDER — DEXTROSE 5 % IV SOLN
2.0000 g | INTRAVENOUS | Status: DC
  Filled 2016-03-13: qty 2

## 2016-03-13 MED ORDER — CEFAZOLIN SODIUM-DEXTROSE 2-4 GM/100ML-% IV SOLN
2.0000 g | Freq: Three times a day (TID) | INTRAVENOUS | Status: DC
  Administered 2016-03-13 – 2016-03-14 (×4): 2 g via INTRAVENOUS
  Filled 2016-03-13 (×6): qty 100

## 2016-03-13 NOTE — Progress Notes (Signed)
Subjective: 1 Day Post-Op Procedure(s) (LRB): IRRIGATION AND DEBRIDEMENT EXTREMITY (N/A)  Patient reports pain as mild to moderate.  Denies fever, chills, N/V. Tolerating POs well.  Denies BM, however admits to flatulence.  Cultures + for staph aureus.  Objective:   VITALS:  Temp:  [97.6 F (36.4 C)-98.1 F (36.7 C)] 97.7 F (36.5 C) (04/06 1032) Pulse Rate:  [66-76] 74 (04/06 1032) Resp:  [14-20] 20 (04/06 1032) BP: (106-125)/(40-61) 106/47 mmHg (04/06 1032) SpO2:  [93 %-98 %] 98 % (04/06 1126)  General: WDWN patient in NAD. Psych:  Appropriate mood and affect. Neuro:  A&O x 3, Moving all extremities, sensation intact to light touch HEENT:  EOMs intact Chest:  Even non-labored respirations Skin:  Dressing C/D/I, no rashes or lesions Extremities: warm/dry, no visible edema, erythema, or echymosis.  No lymphadenopathy. Pulses: Popliteus 2+ MSK:  ROM: EHL/FHL intact, MMT: patient can perform quad set, (-) Homan's    LABS  Recent Labs  03/11/16 1735  HGB 10.7*  WBC 11.5*  PLT 256    Recent Labs  03/11/16 1735 03/13/16 0509  NA 137 138  K 3.9 4.1  CL 103 104  CO2 20* 25  BUN 11 13  CREATININE 0.91 0.90  GLUCOSE 108* 131*   No results for input(s): LABPT, INR in the last 72 hours.   Assessment/Plan: 1 Day Post-Op Procedure(s) (LRB): IRRIGATION AND DEBRIDEMENT EXTREMITY (N/A)  Up with therapy  WBAT LLE in CAM boot ABX per ID. Plan for 2 week outpatient post-op visit with Dr. Lorrin JacksonHewitt  Kayne Yuhas, PA-C, AT[facility] Office:  [phone]

## 2016-03-13 NOTE — Progress Notes (Signed)
[facility]  Patient Status[facility]  [facility] is providing the following services: [facility] will provide home IV ABX as ordered/recommended by [facility] and hospital team.  I contact Mrs. Cathy [name] by phone to discuss her POC for home IV ABX.  Pt stated she did not have an agency preference to support her IV ABX/HH in [place]. I advised pt we will [facility] with an agency in [place] to prepare for DC home. Pt states her husband will be willing and capable to learn and administer her IV ABX at home.  I will teach with the husband tomorrow to prepare for DC to home.   If patient discharges after hours, please call [phone].   Sedalia Mutaamela S Chandler 03/13/2016, 10:54 PM

## 2016-03-13 NOTE — Care Management Note (Signed)
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  PCP: [name]    Action/Plan: ID following: if followup cultures negative, likely pt will d/c on IV vancomycin and ceftriaxone but would prefer to give her targeted therapy.   Expected Discharge Date:                  Expected Discharge Plan:  Home w Home Health Services  In-House Referral:     Discharge planning Services     Post Acute Care Choice:    Choice offered to:  Patient  DME Arranged:  IV pump/equipment DME Agency:  [facility]  HH [facility]     Status of Service:  In process, will continue to follow  Medicare Important Message Given:    Date Medicare IM Given:    Medicare IM give by:    Date Additional Medicare IM Given:    Additional Medicare Important Message give by:     If discussed at Long Length of Stay Meetings, dates discussed:    Additional Comments:  Epifanio LeschesCole, Donne Baley Hudson, RN 03/13/2016, 4:09 PM

## 2016-03-13 NOTE — Progress Notes (Signed)
Pharmacy Antibiotic Note  Cathy [name] is a 75 y.o. female admitted on 03/11/2016 with L ankle infection.  Pharmacy has been consulted for cefazolin and vancomycin dosing. Wound culture reporting Staph aureus with sensitivities pending.   S/p L ankle ORIF 12/2015 S/p L ankle wound I&D and removal of deep implants 03/12/16  Plan: - Continue vancomycin 750 mg IV q12h - Cefazolin 2 g IV q8h - Narrow when sensitivities back - Monitor renal function  Height: 5\' 6"  (167.6 cm) Weight: 177 lb 14.6 oz (80.7 kg) IBW/kg (Calculated) : 59.3  Temp (24hrs), Avg:97.8 F (36.6 C), Min:97.6 F (36.4 C), Max:98.1 F (36.7 C)   Recent Labs Lab 03/11/16 1735 03/13/16 0509  WBC 11.5*  --   CREATININE 0.91 0.90    Estimated Creatinine Clearance: 59.7 mL/min (by C-G formula based on Cr of 0.9).    Allergies  Allergen Reactions  . Penicillins Rash  . Sulfonamide Derivatives Rash    Antimicrobials this admission: Vanc 4/4 x1 dose, 4/5 >>  Cefepime 4/5 >> 4/6 Cefazolin 4/5 x1 dose, 4/6 >>   Dose adjustments this admission:   Microbiology results: 4/4 L ankle wound: Staph aureus 4/5 L ankle tissue: rare GPC pairs 4/5 MRSA PCR: neg  Thank you for allowing pharmacy to be a part of this patient's care.  [place] RiverJennifer [place], [address] BoulevardPh[address].D., BCPS Clinical Pharmacist Pager: [phone] 03/13/2016 11:14 AM

## 2016-03-13 NOTE — Op Note (Signed)
NAMGarfield Cornea:  Cathy [name], Cathy [name]             ACCOUNT NO.:  [id number]  MEDICAL RECORD NO.:  [id number]  LOCATION:  [id number]                        FACILITY:  [facility]  PHYSICIAN:  Toni ArthursJohn Saidah Kempton, MD        DATE OF BIRTH:  August 29, 1941  DATE OF PROCEDURE:  03/11/2016 DATE OF DISCHARGE:                              OPERATIVE REPORT   PREOPERATIVE DIAGNOSIS:  Left ankle infection status post open reduction and internal fixation.  POSTOPERATIVE DIAGNOSIS:  Left ankle infection status post open reduction and internal fixation.  PROCEDURE: 1. Left ankle wound irrigation and debridement including skin,     subcutaneous tissue, and bone. 2. Left ankle removal of deep implants.  SURGEON:  Toni ArthursJohn Janasia Coverdale, MD.  ANESTHESIA:  General.  ESTIMATED BLOOD LOSS:  Minimal.  TOURNIQUET TIME:  23 minutes at 250 mmHg.  COMPLICATIONS:  None apparent.  DISPOSITION:  Extubated, awake, and stable to recovery.  SPECIMENS:  Deep tissue to Microbiology for aerobic and anaerobic culture.  INDICATIONS FOR PROCEDURE:  The patient is a 75 year old woman, who is now about 2 months status post ORIF of her left ankle bimalleolar fracture.  She was generally doing well up until this past weekend, when she began having pain and swelling at her lateral ankle.  She presented to the emergency room yesterday and was found have cellulitis.  She was administered 1 dose of vancomycin.  No antibiotics since then.  She presents now for operative treatment of this infection.  She understands the risks and benefits, the alternative treatment options, and elects surgical treatment.  She specifically understands risks of bleeding, infection, nerve damage, blood clots, need for additional surgery, continued pain, nonunion, amputation, and death.  PROCEDURE IN DETAIL:  After preoperative consent was obtained and the correct operative site was identified, the patient was brought to the operating room and placed supine on the operating table.   General anesthesia was induced.  Preoperative antibiotics were held.  Surgical time-out was taken.  Left lower extremity was prepped and draped in standard sterile fashion with tourniquet around the thigh.  The extremity was exsanguinated and tourniquet was inflated to 250 mmHg. The lateral malleolus incision was identified.  An incision was made, sharp dissection was carried down through skin and subcutaneous tissue to the level of the plate.  The screw heads were identified.  They were all cleared of soft tissue.  The screw were all removed followed by the plate.  The lag screw was then removed as well.  The wound was then debrided circumferentially from the level of the skin down through subcutaneous tissue to the level of the bone.  All necrotic or nonviable appearing material was debrided.  Specimens were collected and sent to Microbiology for aerobic and anaerobic culture.  This was an excisional debridement performed with scalpel and rongeur as well as curette.  The wound was then irrigated copiously with 6 L of normal saline.  The incisions were closed with horizontal mattress and retention sutures of 2-0 nylon.  Sterile dressings were applied followed by compression wrap. Tourniquet was released at 23 minutes after application of the dressings.  The patient was awakened from anesthesia and transported to the recovery room in stable  condition.  2 g of IV cefazolin were administered after cultures were obtained intraoperatively.  FOLLOWUP PLAN:  The patient will be weightbearing as tolerated in a CAM walker boot on the left foot.  She will follow up with me in the office in 2 weeks.  We will get Infectious Disease consultation and start her on IV vancomycin and cefepime.     Toni Arthurs, MD     JH/MEDQ  D:  03/12/2016  T:  03/13/2016  Job:  [id number]

## 2016-03-13 NOTE — Progress Notes (Signed)
Subjective: No new complaints   Antibiotics:  Anti-infectives    Start     Dose/Rate Route Frequency Ordered Stop   03/13/16 1100  ceFAZolin (ANCEF) IVPB 2g/100 mL premix     2 g 200 mL/hr over 30 Minutes Intravenous Every 8 hours 03/13/16 1051     03/13/16 0900  cefTRIAXone (ROCEPHIN) 2 g in dextrose 5 % 50 mL IVPB  Status:  Discontinued     2 g 100 mL/hr over 30 Minutes Intravenous Every 24 hours 03/13/16 0831 03/13/16 1030   03/12/16 1700  ceFEPIme (MAXIPIME) 2 g in dextrose 5 % 50 mL IVPB  Status:  Discontinued     2 g 100 mL/hr over 30 Minutes Intravenous Every 12 hours 03/12/16 1434 03/13/16 0831   03/12/16 1530  vancomycin (VANCOCIN) IVPB 750 mg/150 ml premix     750 mg 150 mL/hr over 60 Minutes Intravenous Every 12 hours 03/12/16 1434     03/11/16 1845  vancomycin (VANCOCIN) IVPB 1000 mg/200 mL premix     1,000 mg 200 mL/hr over 60 Minutes Intravenous  Once 03/11/16 1838 03/11/16 2010      Medications: Scheduled Meds: . atorvastatin  10 mg Oral Daily  . budesonide (PULMICORT) nebulizer solution  0.25 mg Nebulization BID  .  ceFAZolin (ANCEF) IV  2 g Intravenous Q8H  . Chlorhexidine Gluconate Cloth  6 each Topical Daily  . docusate sodium  100 mg Oral BID  . fluticasone  2 spray Each Nare QPM  . mupirocin ointment  1 application Nasal BID  . pantoprazole  40 mg Oral Daily  . pindolol  5 mg Oral Daily  . senna  1 tablet Oral BID  . Suvorexant  1 capsule Oral QHS  . vancomycin  750 mg Intravenous Q12H   Continuous Infusions:  PRN Meds:.acetaminophen **OR** acetaminophen, albuterol, HYDROcodone-acetaminophen, metoCLOPramide **OR** metoCLOPramide (REGLAN) injection, morphine injection, ondansetron **OR** ondansetron (ZOFRAN) IV    Objective: Weight change:   Intake/Output Summary (Last 24 hours) at 03/13/16 2249 Last data filed at 03/13/16 1330  Gross per 24 hour  Intake    360 ml  Output   1200 ml  Net   -840 ml   Blood pressure 131/44, pulse  84, temperature 98.2 F (36.8 C), temperature source Oral, resp. rate 18, height  (1.676 m), weight 177 lb 14.6 oz (80.7 kg), SpO2 94 %. Temp:  [97.7 F (36.5 C)-98.2 F (36.8 C)] 98.2 F (36.8 C) (04/06 2029) Pulse Rate:  [68-84] 84 (04/06 2029) Resp:  [16-20] 18 (04/06 2029) BP: (103-131)/(44-51) 131/44 mmHg (04/06 2029) SpO2:  [93 %-98 %] 94 % (04/06 2033)  Physical Exam: General: Alert and awake, oriented x3, not in any acute distress. HEENT: anicteric sclera, EOMI, oropharynx clear and without exudate Cardiovascular: regular rate, normal r, Pulmonary: no wheezing, resp distress Gastrointestinal: soft nondistended, normal bowel sounds, Musculoskeletal, skin soft tissue: ankle wrapped Neuro: nonfocal, strength and sensation intact  CBC:  CBC Latest Ref Rng 03/11/2016  WBC 4.0 - 10.5 K/uL 11.5(H)  Hemoglobin 12.0 - 15.0 g/dL 10.7(L)  Hematocrit 36.0 - 46.0 % 32.8(L)  Platelets 150 - 400 K/uL 256      BMET  Recent Labs  03/11/16 1735 03/13/16 0509  NA 137 138  K 3.9 4.1  CL 103 104  CO2 20* 25  GLUCOSE 108* 131*  BUN 11 13  CREATININE 0.91 0.90  CALCIUM 8.4* 8.5*     Liver Panel  No results for  input(s): PROT, ALBUMIN, AST, ALT, ALKPHOS, BILITOT, BILIDIR, IBILI in the last 72 hours.     Sedimentation Rate  Recent Labs  03/12/16 1509  ESRSEDRATE 78*   C-Reactive Protein  Recent Labs  03/12/16 1509 03/13/16 0509  CRP 7.3* 5.5*    Micro Results: Recent Results (from the past 720 hour(s))  Wound culture     Status: None (Preliminary result)   Collection Time: 03/11/16  4:57 PM  Result Value Ref Range Status   Specimen Description WOUND LEFT ANKLE  Final   Special Requests Normal  Final   Gram Stain   Final    FEW WBC PRESENT, PREDOMINANTLY PMN NO SQUAMOUS EPITHELIAL CELLS SEEN MODERATE GRAM POSITIVE COCCI IN PAIRS Performed at [facility]    Culture   Final    ABUNDANT STAPHYLOCOCCUS AUREUS Note: RIFAMPIN AND GENTAMICIN  SHOULD NOT BE USED AS SINGLE DRUGS FOR TREATMENT OF STAPH INFECTIONS. Performed at [facility]    Report Status PENDING  Incomplete  Surgical pcr screen     Status: Abnormal   Collection Time: 03/12/16 10:34 AM  Result Value Ref Range Status   MRSA, PCR NEGATIVE NEGATIVE Final   Staphylococcus aureus POSITIVE (A) NEGATIVE Final    Comment:        The Xpert SA Assay (FDA approved for NASAL specimens in patients over 73 years of age), is one component of a comprehensive surveillance program.  Test performance has been validated by [facility] for patients greater than or equal to 776 year old. It is not intended to diagnose infection nor to guide or monitor treatment.   Anaerobic culture     Status: None (Preliminary result)   Collection Time: 03/12/16  1:33 PM  Result Value Ref Range Status   Specimen Description TISSUE LEFT ANKLE  Final   Special Requests NONE  Final   Gram Stain   Final    NO WBC SEEN RARE GRAM POSITIVE COCCI IN PAIRS Performed at [facility]    Culture PENDING  Incomplete   Report Status PENDING  Incomplete  Tissue culture     Status: None (Preliminary result)   Collection Time: 03/12/16  1:33 PM  Result Value Ref Range Status   Specimen Description TISSUE LEFT ANKLE  Final   Special Requests NONE  Final   Gram Stain   Final    NO WBC SEEN RARE GRAM POSITIVE COCCI IN PAIRS Performed at [facility]    Culture PENDING  Incomplete   Report Status PENDING  Incomplete    Studies/Results: No results found.    Assessment/Plan:  INTERVAL HISTORY:   03/13/16: cultures growing Staphylococcus aureus   Active Problems:   Cellulitis   Wound infection after surgery   Hardware complicating wound infection (HCC)   Acute osteomyelitis of left ankle (HCC)   Screen for STD (sexually transmitted disease)    Cathy [name] is a 75 y.o. 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

## 2016-03-14 LAB — WOUND CULTURE: Special Requests: NORMAL

## 2016-03-14 LAB — HCV COMMENT:

## 2016-03-14 LAB — HEPATITIS C ANTIBODY (REFLEX): HCV Ab: <0.1 s/co ratio (ref 0.0–0.9)

## 2016-03-14 MED ORDER — HEPARIN SOD (PORK) LOCK FLUSH 100 UNIT/ML IV SOLN
250.0000 [IU] | INTRAVENOUS | Status: DC | PRN
  Administered 2016-03-14: 250 [IU]
  Filled 2016-03-14: qty 3

## 2016-03-14 MED ORDER — SODIUM CHLORIDE 0.9% FLUSH
10.0000 mL | INTRAVENOUS | Status: DC | PRN
  Administered 2016-03-14: 10 mL
  Filled 2016-03-14: qty 40

## 2016-03-14 MED ORDER — HYDROCODONE-ACETAMINOPHEN 5-325 MG PO TABS: 1.0000 | ORAL_TABLET | ORAL | Status: DC | PRN

## 2016-03-14 MED ORDER — CEFAZOLIN SODIUM-DEXTROSE 2-4 GM/100ML-% IV SOLN: 2.0000 g | Freq: Three times a day (TID) | INTRAVENOUS | Status: AC

## 2016-03-14 MED ORDER — SENNA 8.6 MG PO TABS: 2.0000 | ORAL_TABLET | Freq: Two times a day (BID) | ORAL | Status: DC

## 2016-03-14 MED ORDER — DOCUSATE SODIUM 100 MG PO CAPS: 100.0000 mg | ORAL_CAPSULE | Freq: Two times a day (BID) | ORAL | Status: DC

## 2016-03-14 MED ORDER — HEPARIN SOD (PORK) LOCK FLUSH 100 UNIT/ML IV SOLN
250.0000 [IU] | Freq: Every day | INTRAVENOUS | Status: DC
  Filled 2016-03-14: qty 2.5

## 2016-03-14 NOTE — Progress Notes (Signed)
Subjective: No new complaints   Antibiotics:  Anti-infectives    Start     Dose/Rate Route Frequency Ordered Stop   03/14/16 0000  ceFAZolin (ANCEF) 2-4 GM/100ML-% IVPB     2 g 200 mL/hr over 30 Minutes Intravenous Every 8 hours 03/14/16 1403 04/23/16 2359   03/13/16 1100  ceFAZolin (ANCEF) IVPB 2g/100 mL premix     2 g 200 mL/hr over 30 Minutes Intravenous Every 8 hours 03/13/16 1051     03/13/16 0900  cefTRIAXone (ROCEPHIN) 2 g in dextrose 5 % 50 mL IVPB  Status:  Discontinued     2 g 100 mL/hr over 30 Minutes Intravenous Every 24 hours 03/13/16 0831 03/13/16 1030   03/12/16 1700  ceFEPIme (MAXIPIME) 2 g in dextrose 5 % 50 mL IVPB  Status:  Discontinued     2 g 100 mL/hr over 30 Minutes Intravenous Every 12 hours 03/12/16 1434 03/13/16 0831   03/12/16 1530  vancomycin (VANCOCIN) IVPB 750 mg/150 ml premix  Status:  Discontinued     750 mg 150 mL/hr over 60 Minutes Intravenous Every 12 hours 03/12/16 1434 03/14/16 1028   03/11/16 1845  vancomycin (VANCOCIN) IVPB 1000 mg/200 mL premix     1,000 mg 200 mL/hr over 60 Minutes Intravenous  Once 03/11/16 1838 03/11/16 2010      Medications: Scheduled Meds: . atorvastatin  10 mg Oral Daily  . budesonide (PULMICORT) nebulizer solution  0.25 mg Nebulization BID  .  ceFAZolin (ANCEF) IV  2 g Intravenous Q8H  . Chlorhexidine Gluconate Cloth  6 each Topical Daily  . docusate sodium  100 mg Oral BID  . fluticasone  2 spray Each Nare QPM  . mupirocin ointment  1 application Nasal BID  . pantoprazole  40 mg Oral Daily  . pindolol  5 mg Oral Daily  . senna  1 tablet Oral BID   Continuous Infusions:  PRN Meds:.acetaminophen **OR** acetaminophen, albuterol, HYDROcodone-acetaminophen, metoCLOPramide **OR** metoCLOPramide (REGLAN) injection, morphine injection, ondansetron **OR** ondansetron (ZOFRAN) IV, sodium chloride flush    Objective: Weight change:   Intake/Output Summary (Last 24 hours) at 03/14/16 1550 Last data  filed at 03/14/16 1310  Gross per 24 hour  Intake    760 ml  Output      0 ml  Net    760 ml   Blood pressure 118/51, pulse 76, temperature 98 F (36.7 C), temperature source Oral, resp. rate 16, height _0  (1.676 m), weight 177 lb 14.6 oz (80.7 kg), SpO2 96 %. Temp:  [98 F (36.7 C)-98.2 F (36.8 C)] 98 F (36.7 C) (04/07 1456) Pulse Rate:  [76-84] 76 (04/07 1456) Resp:  [16-18] 16 (04/07 1456) BP: (118-131)/(44-51) 118/51 mmHg (04/07 1456) SpO2:  [94 %-98 %] 96 % (04/07 1456)  Physical Exam: General: Alert and awake, oriented x3, not in any acute distress. HEENT: anicteric sclera, EOMI, oropharynx clear and without exudate Cardiovascular: regular rate, normal r, Pulmonary: no wheezing, resp distress Gastrointestinal: soft nondistended, normal bowel sounds, Musculoskeletal, skin soft tissue: ankle wrapped Neuro: nonfocal, strength and sensation intact  CBC:  CBC Latest Ref Rng 03/11/2016  WBC 4.0 - 10.5 K/uL 11.5(H)  Hemoglobin 12.0 - 15.0 g/dL 10.7(L)  Hematocrit 36.0 - 46.0 % 32.8(L)  Platelets 150 - 400 K/uL 256      BMET  Recent Labs  03/11/16 1735 03/13/16 0509  NA 137 138  K 3.9 4.1  CL 103 104  CO2 20* 25  GLUCOSE  108* 131*  BUN 11 13  CREATININE 0.91 0.90  CALCIUM 8.4* 8.5*     Liver Panel  No results for input(s): PROT, ALBUMIN, AST, ALT, ALKPHOS, BILITOT, BILIDIR, IBILI in the last 72 hours.     Sedimentation Rate  Recent Labs  03/12/16 1509  ESRSEDRATE 78*   C-Reactive Protein  Recent Labs  03/12/16 1509 03/13/16 0509  CRP 7.3* 5.5*    Micro Results: Recent Results (from the past 720 hour(s))  Wound culture     Status: None   Collection Time: 03/11/16  4:57 PM  Result Value Ref Range Status   Specimen Description WOUND LEFT ANKLE  Final   Special Requests Normal  Final   Gram Stain   Final    FEW WBC PRESENT, PREDOMINANTLY PMN NO SQUAMOUS EPITHELIAL CELLS SEEN MODERATE GRAM POSITIVE COCCI IN PAIRS Performed at  [facility]    Culture   Final    ABUNDANT STAPHYLOCOCCUS AUREUS Note: RIFAMPIN AND GENTAMICIN SHOULD NOT BE USED AS SINGLE DRUGS FOR TREATMENT OF STAPH INFECTIONS. This organism DOES NOT demonstrate inducible Clindamycin resistance in vitro. Performed at [facility]    Report Status 03/14/2016 FINAL  Final   Organism ID, Bacteria STAPHYLOCOCCUS AUREUS  Final      Susceptibility   Staphylococcus aureus - MIC*    CLINDAMYCIN <=0.25 SENSITIVE Sensitive     ERYTHROMYCIN >=8 RESISTANT Resistant     GENTAMICIN <=0.5 SENSITIVE Sensitive     LEVOFLOXACIN 4 INTERMEDIATE Intermediate     OXACILLIN 0.5 SENSITIVE Sensitive     RIFAMPIN <=0.5 SENSITIVE Sensitive     TRIMETH/SULFA <=10 SENSITIVE Sensitive     VANCOMYCIN <=0.5 SENSITIVE Sensitive     TETRACYCLINE <=1 SENSITIVE Sensitive     MOXIFLOXACIN 1 INTERMEDIATE Intermediate     * ABUNDANT STAPHYLOCOCCUS AUREUS  Surgical pcr screen     Status: Abnormal   Collection Time: 03/12/16 10:34 AM  Result Value Ref Range Status   MRSA, PCR NEGATIVE NEGATIVE Final   Staphylococcus aureus POSITIVE (A) NEGATIVE Final    Comment:        The Xpert SA Assay (FDA approved for NASAL specimens in patients over 68 years of age), is one component of a comprehensive surveillance program.  Test performance has been validated by [facility] for patients greater than or equal to 27 year old. It is not intended to diagnose infection nor to guide or monitor treatment.   Anaerobic culture     Status: None (Preliminary result)   Collection Time: 03/12/16  1:33 PM  Result Value Ref Range Status   Specimen Description TISSUE LEFT ANKLE  Final   Special Requests NONE  Final   Gram Stain   Final    NO WBC SEEN RARE GRAM POSITIVE COCCI IN PAIRS Performed at [facility]    Culture   Final    NO ANAEROBES ISOLATED; CULTURE IN PROGRESS FOR 5 DAYS Performed at [facility]    Report Status PENDING  Incomplete  Tissue  culture     Status: None (Preliminary result)   Collection Time: 03/12/16  1:33 PM  Result Value Ref Range Status   Specimen Description TISSUE LEFT ANKLE  Final   Special Requests NONE  Final   Gram Stain   Final    NO WBC SEEN RARE GRAM POSITIVE COCCI IN PAIRS Performed at [facility]    Culture   Final    ABUNDANT STAPHYLOCOCCUS AUREUS Note: RIFAMPIN AND GENTAMICIN SHOULD  NOT BE USED AS SINGLE DRUGS FOR TREATMENT OF STAPH INFECTIONS. FEW GRAM NEGATIVE RODS Performed at [facility]    Report Status PENDING  Incomplete    Studies/Results: No results found.    Assessment/Plan:  INTERVAL HISTORY:   03/13/16: cultures growing Staphylococcus aureus 03/14/16: cultures = MSSA  Active Problems:   Cellulitis   Wound infection after surgery   Hardware complicating wound infection ([place])   Acute osteomyelitis of left ankle ([place])   Screen for STD (sexually transmitted disease)   Staphylococcus aureus infection    Cathy [name] is a 75 y.o. 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 Penicillins Rash  . Sulfonamide Derivatives Rash    Discharge antibiotics: Cefazolin 2 g IV every 8 hours   Duration: 6 weeks postop  End Date:  04/22/16  [facility] Care Per Protocol:  Labs weekly while on IV antibiotics: _x_ CBC with differential _x_ BMP x__ CRP _x_ ESR   Fax weekly labs to [phone]  Clinic Follow Up Appt:  Next 4 weeks    LOS: 2 days   Alcide Evener 03/14/2016, 3:50 PM

## 2016-03-14 NOTE — Progress Notes (Signed)
[facility]  Pt [facility].  [facility] @ [phone].  Contact Fernand ParkinsMary Yonjef, RN at [facility] who will meet with family today prior to DC to arrange [facility].    If patient discharges after hours, please call [phone].   Sedalia Mutaamela S Chandler 03/14/2016, 1:00 PM

## 2016-03-14 NOTE — Discharge Summary (Signed)
Physician Discharge Summary  Patient ID: Cathy [name] MRN: [id number] DOB/AGE: 73/07/1941 73 y.o.  Admit date: 03/11/2016 Discharge date: 03/14/2016  Admission Diagnoses:  LLE cellutitis; wound infection after surgery, hardware complicating wound infection; acute osteomyelitis of left ankle; staph aureus infection; paroxysmal atrial tachycardia; insomnia  Discharge Diagnoses:  Active Problems:   Cellulitis   Wound infection after surgery   Hardware complicating wound infection (HCC)   Acute osteomyelitis of left ankle (HCC)   Screen for STD (sexually transmitted disease)   Staphylococcus aureus infection same as above  Discharged Condition: stable  Hospital Course: Patient presented to the [facility] on 03/11/2016 c/o increased pain and purulent drainage at left ankle 3 months S/P ORIF L ankle fracture by Dr. Toni ArthursJohn Hewitt.  The patient was admitted to the hospital for IV ABX.  The patient was taken to the OR for I&D of left ankle wound, removal of deep implants and intra-op cultures by Dr. Toni ArthursJohn Hewitt on 03/12/2016. The patient tolerated the procedure well without complication.  ID was consulted.  Intra-op cultures were positive for Staph aureus.  A PICC line was placed on 03/14/2016 and the patient [facility]f [facility] on 03/14/2016.  The patient tolerated her stay well without complication.  Consults: ID  Significant Diagnostic Studies: labs: intra-op cultures.  Radiographs to ensure complete removal of hardware during operative procedure.  Treatments: IV hydration, antibiotics: Ancef and vancomycin, analgesia: acetaminophen, acetaminophen w/ codeine and Morphine, procedures: PICC line and surgery: as stated above  Discharge Exam: Blood pressure 118/51, pulse 76, temperature 98 F (36.7 C), temperature source Oral, resp. rate 16, height 5\' 6"  (1.676 m), weight 80.7 kg (177 lb 14.6 oz), SpO2 96 %. General: WDWN patient in NAD. Psych:  Appropriate  mood and affect. Neuro:  A&O x 3, Moving all extremities, sensation intact to light touch HEENT:  EOMs intact Chest:  Even non-labored respirations Skin:  Dressing C/D/I, no rashes or lesions Extremities: warm/dry, no visible edema, erythema, or echymosis.  No lymphadenopathy. Pulses: Popliteus 2+ MSK:  ROM: EHL/FHL intact, MMT: patient can perform quad set, (-) Homan's   [facility]  Discharge Instructions    Call MD / Call 911    Complete by:  As directed   If you experience chest pain or shortness of breath, CALL 911 and be transported to the hospital emergency room.  If you develope a fever above 101 F, pus (white drainage) or increased drainage or redness at the wound, or calf pain, call your surgeon's office.     Constipation Prevention    Complete by:  As directed   Drink plenty of fluids.  Prune juice may be helpful.  You may use a stool softener, such as Colace (over the counter) 100 mg twice a day.  Use MiraLax (over the counter) for constipation as needed.     Diet - low sodium heart healthy    Complete by:  As directed      Increase activity slowly as tolerated    Complete by:  As directed      Weight bearing as tolerated    Complete by:  As directed   Laterality:  left  Extremity:  Lower  In CAM boot.            Medication List    TAKE these medications        albuterol 108 (90 Base) MCG/ACT inhaler  Commonly known as:  PROVENTIL HFA  Inhale 2 puffs into the lungs every 6 (six) hours as needed for wheezing or shortness of breath.     alendronate 35 MG tablet  Commonly known as:  FOSAMAX  Take 1 tablet by mouth Once a week.     atorvastatin 10 MG tablet  Commonly known as:  LIPITOR  Take 10 mg by mouth daily.     beclomethasone 40 MCG/ACT inhaler  Commonly known as:  QVAR  Inhale 2 puffs into the lungs 2 (two) times daily. Rinse mouth     Biotin 1000 MCG tablet  Take 1,000 mcg by mouth daily.     CALTRATE 600 1500 (600 Ca) MG  Tabs tablet  Generic drug:  calcium carbonate  Take by mouth daily.     ceFAZolin 2-4 GM/100ML-% IVPB  Commonly known as:  ANCEF  Inject 100 mLs (2 g total) into the vein every 8 (eight) hours.     docusate sodium 100 MG capsule  Commonly known as:  COLACE  Take 1 capsule (100 mg total) by mouth 2 (two) times daily. While taking narcotic pain medicine.     etodolac 500 MG tablet  Commonly known as:  LODINE  Take 1 tablet by mouth 2 (two) times daily.     fluticasone 50 MCG/ACT nasal spray  Commonly known as:  FLONASE  Place 2 sprays into the nose every evening.     HYDROcodone-acetaminophen 5-325 MG tablet  Commonly known as:  NORCO/VICODIN  Take 1-2 tablets by mouth every 4 (four) hours as needed for moderate pain or severe pain.     Krill Oil 1000 MG Caps  Take 1 capsule by mouth daily.     Magnesium 500 MG Tabs  Take 1,000 mg by mouth daily.     multivitamin tablet  Take 1 tablet by mouth daily.     pantoprazole 40 MG tablet  Commonly known as:  PROTONIX  Take 40 mg by mouth daily.     pindolol 5 MG tablet  Commonly known as:  VISKEN  Take 5 mg by mouth daily.     senna 8.6 MG Tabs tablet  Commonly known as:  SENOKOT  Take 2 tablets (17.2 mg total) by mouth 2 (two) times daily.     Suvorexant 15 MG Tabs  Commonly known as:  BELSOMRA  Take 1 capsule by mouth at bedtime.     Vitamin D (Ergocalciferol) 50000 units Caps capsule  Commonly known as:  DRISDOL  Take 1 capsule by mouth once a week.           Follow-up Information    Follow up with HEWITT, Jonny Ruiz, MD. Schedule an appointment as soon as possible for a visit in 2 weeks.   Specialty:  Orthopedic Surgery   Contact information:   [address] [place] [address] [phone]       Follow up with [facility].   Why:  IV Abx   Contact information:   [address] [place] [address] [phone]       Follow up with [facility]   Why:   USE BRANCH LOCATION PHONE NUMBER MARY YONJOFF PROVIDED. Call when you get home to have [facility] RN come and give 1st dose of Antibiotics   Contact information:   [address] [place] [address] [phone]       Signed: Alfredo Martinez, Cordelia Poche, ATC [facility] Office:  [phone]

## 2016-03-14 NOTE — Progress Notes (Signed)
Subjective: 2 Days Post-Op Procedure(s) (LRB): IRRIGATION AND DEBRIDEMENT EXTREMITY (N/A)  Patient reports pain as mild to moderate.  Tolerating POs well.  Admits to BM.  Denies fever, chills, N/V.  Objective:   VITALS:  Temp:  [97.7 F (36.5 C)-98.2 F (36.8 C)] 98.2 F (36.8 C) (04/06 2029) Pulse Rate:  [74-84] 84 (04/06 2029) Resp:  [18-20] 18 (04/06 2029) BP: (103-131)/(44-49) 131/44 mmHg (04/06 2029) SpO2:  [94 %-98 %] 94 % (04/06 2033)  General: WDWN patient in NAD. Psych:  Appropriate mood and affect. Neuro:  A&O x 3, Moving all extremities, sensation intact to light touch HEENT:  EOMs intact Chest:  Even non-labored respirations Skin: Dressing C/D/I, no rashes or lesions Extremities: warm/dry, no visible edema, erythema, or echymosis.  No lymphadenopathy. Pulses: Popliteus 2+ MSK:  ROM: EHL/FHL intact, MMT: patient can perform quad set, (-) Homan's    LABS  Recent Labs  03/11/16 1735  HGB 10.7*  WBC 11.5*  PLT 256    Recent Labs  03/11/16 1735 03/13/16 0509  NA 137 138  K 3.9 4.1  CL 103 104  CO2 20* 25  BUN 11 13  CREATININE 0.91 0.90  GLUCOSE 108* 131*   No results for input(s): LABPT, INR in the last 72 hours.   Assessment/Plan: 2 Days Post-Op Procedure(s) (LRB): IRRIGATION AND DEBRIDEMENT EXTREMITY (N/A)  Up with therapy  WBAT LLE in CAM boot Plan for patient to be [facility] and administration of IV ABX per ID -  Appreciate ID assistance with patient. Awaiting culture sensitivity Plan for 2 week outpatient post-op visit with Dr. Victorino DikeHewitt.  Alfredo MartinezJustin Shauntia Levengood, PA-C, ATC [facility] Office:  [phone]

## 2016-03-14 NOTE — Care Management Important Message (Signed)
Important Message  Patient Details  Name: Cathy [name] MRN: [id number] Date of Birth: 01/25/1941   Medicare Important Message Given:  Yes    Kyla BalzarineShealy, Sharica Roedel Abena 03/14/2016, 10:38 AM

## 2016-03-14 NOTE — Care Management Note (Addendum)
Case Management Note  Patient Details  Name: Cathy [name] MRN: [id number] Date of Birth: 05/26/1941  Subjective/Objective:                 Patien to discharge [facility]em [facility] [place]. Patient admitted through ortho for infected hardware from ORIF to ankle. [facility] with F2F from dr Hewitt's office. Faxed IV Abx script to [phone] [facility]. IV Abx will be delivered to room btwn 4:30 and 5:00, Davonna BellingMary Yonjoff with [facility] notifed. Patient instructed by [facility] t [facility] when they arrive home to have RN sent to them to administer 1st dose.    Action/Plan:  Will DC [facility]  [facility]. Expected Discharge Date:                  Expected Discharge Plan:  Home w Home Health Services  In-House Referral:     Discharge planning Services  CM Consult  Post Acute Care Choice:  Home Health Choice offered to:  Patient  DME Arranged:  IV pump/equipment DME Agency:  [facility]  HH Arranged:  [facility]  Status of Service:  In process, will continue to follow  Medicare Important Message Given:  Yes Date Medicare IM Given:    Medicare IM give by:    Date Additional Medicare IM Given:    Additional Medicare Important Message give by:     If discussed at Long Length of Stay Meetings, dates discussed:    Additional Comments:  Lawerance SabalDebbie Wilhelmine Krogstad, RN 03/14/2016, 1:58 PM

## 2016-03-14 NOTE — Progress Notes (Signed)
Pt being discharged home via wheelchair with family. Pt alert and oriented x4. VSS. Pt c/o no pain at this time. No signs of respiratory distress. Education complete and care plans resolved. IV removed with catheter intact and pt tolerated well. PICC placed and education complete. No further issues at this time. Pt to follow up with PCP. Jillyn HiddenStone,Shericka Johnstone R, RN

## 2016-03-14 NOTE — Progress Notes (Signed)
Peripherally Inserted Central Catheter/Midline Placement  The IV Nurse has discussed with the patient and/or persons authorized to consent for the patient, the purpose of this procedure and the potential benefits and risks involved with this procedure.  The benefits include less needle sticks, lab draws from the catheter and patient may be discharged home with the catheter.  Risks include, but not limited to, infection, bleeding, blood clot (thrombus formation), and puncture of an artery; nerve damage and irregular heat beat.  Alternatives to this procedure were also discussed.  PICC/Midline Placement Documentation        Cathy [name], Cathy [name] 03/14/2016, 12:55 PM

## 2016-03-14 NOTE — Discharge Instructions (Signed)
Toni ArthursJohn Hewitt, MD [facility]  Please read the following information regarding your care after surgery.  Medications  You only need a prescription for the narcotic pain medicine (ex. oxycodone, Percocet, Norco).  All of the other medicines listed below are available over the counter. X acetominophen (Tylenol) 650 mg every 4-6 hours as you need for minor pain X hydrocodone as prescribed for moderate to severe pain ?   Narcotic pain medicine (ex. oxycodone, Percocet, Vicodin) will cause constipation.  To prevent this problem, take the following medicines while you are taking any pain medicine. X docusate sodium (Colace) 100 mg twice a day X senna (Senokot) 2 tablets twice a day    Weight Bearing X Bear weight when you are able on your operated leg or foot in CAM boot.   Cast / Splint / Dressing X Keep your dressing clean and dry.  Dont put anything (coat hanger, pencil, etc) down inside of it.  If it gets damp, use a hair dryer on the cool setting to dry it.  If it gets soaked, call the office to schedule an appointment for a cast change.    After your dressing, cast or splint is removed; you may shower, but do not soak or scrub the wound.  Allow the water to run over it, and then gently pat it dry.  Swelling It is normal for you to have swelling where you had surgery.  To reduce swelling and pain, keep your toes above your nose for at least 3 days after surgery.  It may be necessary to keep your foot or leg elevated for several weeks.  If it hurts, it should be elevated.  Follow Up Call my office at [phone] when you are discharged from the hospital or surgery center to schedule an appointment to be seen two weeks after surgery.  Call my office at [phone] if you develop a fever >101.5 F, nausea, vomiting, bleeding from the surgical site or severe pain.

## 2016-03-15 LAB — TISSUE CULTURE: Gram Stain: NONE SEEN

## 2016-03-17 LAB — ANAEROBIC CULTURE: GRAM STAIN: NONE SEEN

## 2016-03-28 ENCOUNTER — Encounter: Payer: Self-pay | Admitting: Internal Medicine

## 2016-03-28 MED ORDER — FLUTICASONE PROPIONATE HFA 110 MCG/ACT IN AERO: 2.0000 | INHALATION_SPRAY | Freq: Two times a day (BID) | RESPIRATORY_TRACT | Status: DC

## 2016-03-28 NOTE — Telephone Encounter (Signed)
[facility].   Patient notified via myChart. Nothing further needed.

## 2016-04-10 ENCOUNTER — Encounter: Payer: Self-pay | Admitting: Infectious Disease

## 2016-04-14 ENCOUNTER — Telehealth: Payer: Self-pay

## 2016-04-14 ENCOUNTER — Ambulatory Visit (INDEPENDENT_AMBULATORY_CARE_PROVIDER_SITE_OTHER): Payer: Medicare Other | Admitting: Infectious Disease

## 2016-04-14 VITALS — BP 136/84 | HR 65 | Temp 97.4°F | Wt 178.0 lb

## 2016-04-14 DIAGNOSIS — A4901 Methicillin susceptible Staphylococcus aureus infection, unspecified site: Secondary | ICD-10-CM

## 2016-04-14 DIAGNOSIS — M86172 Other acute osteomyelitis, left ankle and foot: Secondary | ICD-10-CM | POA: Diagnosis not present

## 2016-04-14 DIAGNOSIS — T847XXD Infection and inflammatory reaction due to other internal orthopedic prosthetic devices, implants and grafts, subsequent encounter: Secondary | ICD-10-CM

## 2016-04-14 MED ORDER — TESAMORELIN ACETATE 2 MG ~~LOC~~ SOLR: 2.0000 mg | Freq: Every day | SUBCUTANEOUS | Status: DC

## 2016-04-14 MED ORDER — CEPHALEXIN 500 MG PO CAPS: 1000.0000 mg | ORAL_CAPSULE | Freq: Two times a day (BID) | ORAL | Status: DC

## 2016-04-14 NOTE — Telephone Encounter (Signed)
Called [facility] and spoke with Coretta and gave Dr. Zenaida NieceVan Dam's verbal order to discontinue PICC line on May 16th. Rejeana Brockandace Destyn Schuyler, LPN.

## 2016-04-14 NOTE — Progress Notes (Signed)
Subjective:   Chief complaint: followup for MSSA infected hardware associated osteomyelitis   Patient ID: Cathy [name], female    DOB: 06/25/1941, 75 y.o.   MRN: [id number]  HPI   75 y.o. 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 She states that the pain at her operative site is greatly improved. She has been following with Dr. Victorino DikeHewitt closely and he is pleased per pt account with her progress.  Past Medical History  Diagnosis Date  . Paroxysmal supraventricular tachycardia (HCC)   . Unspecified asthma(493.90)   . Allergic rhinitis, cause unspecified   . Wound infection after surgery 03/2016    Past Surgical History  Procedure Laterality Date  . Toe surgery    . Appendectomy    . Total abdominal hysterectomy    . Bilateral salpingoophorectomy    . Tonsillectomy    . Squamous cell cancer face    . Vocal cord nodules    . Ankle fracture surgery Left 12/2015  . I&d extremity N/A 03/12/2016    Procedure: IRRIGATION AND DEBRIDEMENT EXTREMITY;  Surgeon: Toni ArthursJohn Hewitt, MD;  Location: [facility] OR;  Service: Orthopedics;  Laterality: N/A;    No family history on file.    Social History   Social History  . Marital Status: Married    Spouse Name: N/A  . Number of Children: N/A  . Years of Education: N/A   Occupational History  . [employer] operational officersecretary    Social History Main Topics  . Smoking status: Never Smoker   . Smokeless tobacco: Never Used  . Alcohol Use: No  . Drug Use: No  . Sexual Activity: Not on file   Other Topics Concern  . Not on file   Social History Narrative    Allergies  Allergen Reactions  . Penicillins Rash  . Sulfonamide Derivatives Rash     Current outpatient prescriptions:  .  albuterol (PROVENTIL HFA) 108 (90 BASE) MCG/ACT inhaler, Inhale 2 puffs into the lungs every 6 (six) hours as  needed for wheezing or shortness of breath., Disp: 1 Inhaler, Rfl: prn .  alendronate (FOSAMAX) 35 MG tablet, Take 1 tablet by mouth Once a week., Disp: , Rfl:  .  atorvastatin (LIPITOR) 10 MG tablet, Take 10 mg by mouth daily., Disp: , Rfl:  .  beclomethasone (QVAR) 40 MCG/ACT inhaler, Inhale 2 puffs into the lungs 2 (two) times daily. Rinse mouth, Disp: 3 Inhaler, Rfl: 3 .  Biotin 1000 MCG tablet, Take 1,000 mcg by mouth daily., Disp: , Rfl:  .  ceFAZolin (ANCEF) 2-4 GM/100ML-% IVPB, Inject 100 mLs (2 g total) into the vein every 8 (eight) hours., Disp: 1 each, Rfl: 0 .  etodolac (LODINE) 500 MG tablet, Take 1 tablet by mouth 2 (two) times daily., Disp: , Rfl:  .  fluticasone (FLONASE) 50 MCG/ACT nasal spray, Place 2 sprays into the nose every evening., Disp: , Rfl:  .  Magnesium 500 MG TABS, Take 1,000 mg by mouth daily., Disp: , Rfl:  .  Multiple Vitamin (MULTIVITAMIN) tablet, Take 1 tablet by mouth daily.  , Disp: , Rfl:  .  pantoprazole (PROTONIX) 40 MG tablet, Take 40 mg by mouth daily., Disp: , Rfl:  .  pindolol (VISKEN) 5 MG tablet, Take 5 mg by mouth daily.  , Disp: , Rfl:  .  Vitamin D, Ergocalciferol, (  DRISDOL) 50000 units CAPS capsule, Take 1 capsule by mouth once a week., Disp: , Rfl:  .  Calcium Carbonate (CALTRATE 600) 1500 MG TABS, Take by mouth daily. Reported on 04/14/2016, Disp: , Rfl:  .  cephALEXin (KEFLEX) 500 MG capsule, Take 2 capsules (1,000 mg total) by mouth 2 (two) times daily., Disp: 120 capsule, Rfl: 2 .  docusate sodium (COLACE) 100 MG capsule, Take 1 capsule (100 mg total) by mouth 2 (two) times daily. While taking narcotic pain medicine. (Patient not taking: Reported on 04/14/2016), Disp: 30 capsule, Rfl: 0 .  fluticasone (FLOVENT HFA) 110 MCG/ACT inhaler, Inhale 2 puffs into the lungs 2 (two) times daily. (Patient not taking: Reported on 04/14/2016), Disp: 3 Inhaler, Rfl: 3 .  HYDROcodone-acetaminophen (NORCO/VICODIN) 5-325 MG tablet, Take 1-2 tablets by mouth every 4  (four) hours as needed for moderate pain or severe pain. (Patient not taking: Reported on 04/14/2016), Disp: 30 tablet, Rfl: 0 .  KRILL OIL 1000 MG CAPS, Take 1 capsule by mouth daily. Reported on 04/14/2016, Disp: , Rfl:  .  senna (SENOKOT) 8.6 MG TABS tablet, Take 2 tablets (17.2 mg total) by mouth 2 (two) times daily. (Patient not taking: Reported on 04/14/2016), Disp: 30 each, Rfl: 0 .  Suvorexant (BELSOMRA) 15 MG TABS, Take 1 capsule by mouth at bedtime. (Patient not taking: Reported on 04/14/2016), Disp: 30 tablet, Rfl: 0 .  Tesamorelin Acetate (EGRIFTA) 2 MG SOLR, Inject 2 mg into the skin daily., Disp: 30 each, Rfl: 3   Review of Systems  Constitutional: Negative for fever, chills, diaphoresis, activity change, appetite change, fatigue and unexpected weight change.  HENT: Negative for congestion, rhinorrhea, sinus pressure, sneezing, sore throat and trouble swallowing.   Eyes: Negative for photophobia and visual disturbance.  Respiratory: Negative for cough, chest tightness, shortness of breath, wheezing and stridor.   Cardiovascular: Negative for chest pain, palpitations and leg swelling.  Gastrointestinal: Negative for nausea, vomiting, abdominal pain, diarrhea, constipation, blood in stool, abdominal distention and anal bleeding.  Genitourinary: Negative for dysuria, hematuria, flank pain and difficulty urinating.  Musculoskeletal: Negative for myalgias, back pain, joint swelling, arthralgias and gait problem.  Skin: Positive for wound. Negative for color change, pallor and rash.  Neurological: Negative for dizziness, tremors, weakness and light-headedness.  Hematological: Negative for adenopathy. Does not bruise/bleed easily.  Psychiatric/Behavioral: Negative for behavioral problems, confusion, sleep disturbance, dysphoric mood, decreased concentration and agitation.       Objective:   Physical Exam  Constitutional: She is oriented to person, place, and time. She appears well-developed  and well-nourished. No distress.  HENT:  Head: Normocephalic and atraumatic.  Mouth/Throat: No oropharyngeal exudate.  Eyes: Conjunctivae and EOM are normal. No scleral icterus.  Neck: Normal range of motion. Neck supple.  Cardiovascular: Normal rate and regular rhythm.   Pulmonary/Chest: Effort normal. No respiratory distress. She has no wheezes.  Abdominal: She exhibits no distension.  Musculoskeletal: She exhibits no edema or tenderness.  Neurological: She is alert and oriented to person, place, and time. She exhibits normal muscle tone. Coordination normal.  Skin: Skin is warm and dry. No rash noted. She is not diaphoretic. No erythema. No pallor.     Psychiatric: She has a normal mood and affect. Her behavior is normal. Judgment and thought content normal.          Assessment & Plan:    MSSA osteomyelitis associated with hardware sp removal of hardware (on lateral side not medial side where there is still hardware)  --fiinish  IV abx and follow this with PO keflex and followup in our clinic

## 2016-04-16 ENCOUNTER — Encounter: Payer: Self-pay | Admitting: Infectious Disease

## 2016-04-17 NOTE — Telephone Encounter (Signed)
RN spoke with Dr. Natividad Brood. Van Dam.  The Egrifta prescription was entered in error.  Egrifta rx removed from the pt's medication profile.  Pt's pharmacy notified and they removed for the pt's medication profile.  Pt notified by [employer] message.

## 2016-05-01 ENCOUNTER — Telehealth: Payer: Self-pay | Admitting: *Deleted

## 2016-05-01 DIAGNOSIS — T847XXS Infection and inflammatory reaction due to other internal orthopedic prosthetic devices, implants and grafts, sequela: Secondary | ICD-10-CM

## 2016-05-01 DIAGNOSIS — M86172 Other acute osteomyelitis, left ankle and foot: Secondary | ICD-10-CM

## 2016-05-01 MED ORDER — DOXYCYCLINE MONOHYDRATE 100 MG PO TABS: 100.0000 mg | ORAL_TABLET | Freq: Two times a day (BID) | ORAL | Status: DC

## 2016-05-01 NOTE — Telephone Encounter (Signed)
Itching and rash after starting Keflex 04/24/16 - started Benadryl with slight relief.  Rash/itching started on her scalp 04/25/16.  Full body rash and itching started 04/29/16.  RN paged Dr. Daiva EvesVan Dam to report the pt's reaction to Keflex.  Obtained verbal order from Dr. Daiva EvesVan Dam to stop the Keflex and start Doxycycline 100 mg twice daily by mouth with 5 refills.  Sent prescription to the patient's pharmacy.  Notified pt to stop Keflex and that Dr. Daiva EvesVan Dam has changed the antibiotic to doxycycline.  Dr. Daiva EvesVan Dam wants her to continue the doxycycline until her return appointment in July.  Patient verbalized understanding.

## 2016-05-22 ENCOUNTER — Encounter: Payer: Self-pay | Admitting: Infectious Disease

## 2016-06-16 ENCOUNTER — Ambulatory Visit: Payer: Medicare Other | Admitting: Infectious Disease

## 2016-07-02 ENCOUNTER — Ambulatory Visit (INDEPENDENT_AMBULATORY_CARE_PROVIDER_SITE_OTHER): Payer: Medicare Other | Admitting: Infectious Disease

## 2016-07-02 ENCOUNTER — Encounter: Payer: Self-pay | Admitting: Infectious Disease

## 2016-07-02 VITALS — BP 127/72 | HR 76 | Temp 97.8°F | Wt 177.0 lb

## 2016-07-02 DIAGNOSIS — T847XXD Infection and inflammatory reaction due to other internal orthopedic prosthetic devices, implants and grafts, subsequent encounter: Secondary | ICD-10-CM | POA: Diagnosis not present

## 2016-07-02 DIAGNOSIS — A4901 Methicillin susceptible Staphylococcus aureus infection, unspecified site: Secondary | ICD-10-CM

## 2016-07-02 DIAGNOSIS — M86172 Other acute osteomyelitis, left ankle and foot: Secondary | ICD-10-CM

## 2016-07-02 LAB — BASIC METABOLIC PANEL WITH GFR
BUN: 23 mg/dL (ref 7–25)
CALCIUM: 9.3 mg/dL (ref 8.6–10.4)
CO2: 24 mmol/L (ref 20–31)
CREATININE: 0.97 mg/dL — AB (ref 0.60–0.93)
Chloride: 104 mmol/L (ref 98–110)
GFR, Est African American: 67 mL/min (ref >=60)
GFR, Est Non African American: 58 mL/min — ABNORMAL LOW (ref >=60)
Glucose, Bld: 102 mg/dL — ABNORMAL HIGH (ref 65–99)
Potassium: 4.4 mmol/L (ref 3.5–5.3)
SODIUM: 141 mmol/L (ref 135–146)

## 2016-07-02 LAB — C-REACTIVE PROTEIN

## 2016-07-02 NOTE — Progress Notes (Signed)
Subjective:   Chief complaint: followup for MSSA infected hardware associated osteomyelitis   Patient ID: Cathy [name], female    DOB: [birth year], 75 y.o.   MRN: [id number]  HPI    75 y.o. 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 She states that the pain at her operative site is greatly improved. She had been following with Dr. Victorino Dike closely and he has action now "released her.  We had changed her from cefazolin to oral Keflex but then she developed a diffuse body rash and we change her to oral doxycycline which she has been on since then. Her ankle pain is much improved and she barely notices now. We will check inflammatory markers today a fair reassuring take her off of her doxycycline.    Past Medical History:  Diagnosis Date  . Allergic rhinitis, cause unspecified   . Paroxysmal supraventricular tachycardia (HCC)   . Unspecified asthma(493.90)   . Wound infection after surgery 03/2016    Past Surgical History:  Procedure Laterality Date  . ANKLE FRACTURE SURGERY Left 12/2015  . APPENDECTOMY    . BILATERAL SALPINGOOPHORECTOMY    . I&D EXTREMITY N/A 03/12/2016   Procedure: IRRIGATION AND DEBRIDEMENT EXTREMITY;  Surgeon: Toni Arthurs, MD;  Location: [facility] OR;  Service: Orthopedics;  Laterality: N/A;  . squamous cell cancer face    . TOE SURGERY    . TONSILLECTOMY    . TOTAL ABDOMINAL HYSTERECTOMY    . vocal cord nodules      No family history on file.    Social History   Social History  . Marital status: Married    Spouse name: N/A  . Number of children: N/A  . Years of education: N/A   Occupational History  . [employer] operational officer    Social History Main Topics  . Smoking status: Never Smoker  . Smokeless tobacco: Never Used  . Alcohol use No  . Drug use: No  . Sexual activity: Not Asked   Other  Topics Concern  . None   Social History Narrative  . None    Allergies  Allergen Reactions  . Keflex [Cephalexin] Hives  . Penicillins Rash  . Sulfonamide Derivatives Rash     Current Outpatient Prescriptions:  .  alendronate (FOSAMAX) 35 MG tablet, Take 1 tablet by mouth Once a week., Disp: , Rfl:  .  atorvastatin (LIPITOR) 10 MG tablet, Take 10 mg by mouth daily., Disp: , Rfl:  .  beclomethasone (QVAR) 40 MCG/ACT inhaler, Inhale 2 puffs into the lungs 2 (two) times daily. Rinse mouth, Disp: 3 Inhaler, Rfl: 3 .  Biotin 1000 MCG tablet, Take 1,000 mcg by mouth daily., Disp: , Rfl:  .  Calcium Carbonate (CALTRATE 600) 1500 MG TABS, Take by mouth daily. Reported on 04/14/2016, Disp: , Rfl:  .  docusate sodium (COLACE) 100 MG capsule, Take 1 capsule (100 mg total) by mouth 2 (two) times daily. While taking narcotic pain medicine., Disp: 30 capsule, Rfl: 0 .  doxycycline (ADOXA) 100 MG tablet, Take 1 tablet (100 mg total) by mouth 2 (two) times daily., Disp: 606 tablet, Rfl: 5 .  etodolac (LODINE) 500 MG tablet, Take 1 tablet by mouth 2 (two) times daily., Disp: , Rfl:  .  fluticasone (FLONASE) 50 MCG/ACT nasal spray, Place 2 sprays into the nose every evening., Disp: ,  Rfl:  .  fluticasone (FLOVENT HFA) 110 MCG/ACT inhaler, Inhale 2 puffs into the lungs 2 (two) times daily., Disp: 3 Inhaler, Rfl: 3 .  HYDROcodone-acetaminophen (NORCO/VICODIN) 5-325 MG tablet, Take 1-2 tablets by mouth every 4 (four) hours as needed for moderate pain or severe pain., Disp: 30 tablet, Rfl: 0 .  KRILL OIL 1000 MG CAPS, Take 1 capsule by mouth daily. Reported on 04/14/2016, Disp: , Rfl:  .  Magnesium 500 MG TABS, Take 1,000 mg by mouth daily., Disp: , Rfl:  .  Multiple Vitamin (MULTIVITAMIN) tablet, Take 1 tablet by mouth daily.  , Disp: , Rfl:  .  pantoprazole (PROTONIX) 40 MG tablet, Take 40 mg by mouth daily., Disp: , Rfl:  .  pindolol (VISKEN) 5 MG tablet, Take 5 mg by mouth daily.  , Disp: , Rfl:  .  senna  (SENOKOT) 8.6 MG TABS tablet, Take 2 tablets (17.2 mg total) by mouth 2 (two) times daily., Disp: 30 each, Rfl: 0 .  Suvorexant (BELSOMRA) 15 MG TABS, Take 1 capsule by mouth at bedtime., Disp: 30 tablet, Rfl: 0 .  Vitamin D, Ergocalciferol, (DRISDOL) 50000 units CAPS capsule, Take 1 capsule by mouth once a week., Disp: , Rfl:  .  albuterol (PROVENTIL HFA) 108 (90 BASE) MCG/ACT inhaler, Inhale 2 puffs into the lungs every 6 (six) hours as needed for wheezing or shortness of breath., Disp: 1 Inhaler, Rfl: prn   Review of Systems  Constitutional: Negative for activity change, appetite change, chills, diaphoresis, fatigue, fever and unexpected weight change.  HENT: Negative for congestion, rhinorrhea, sinus pressure, sneezing, sore throat and trouble swallowing.   Eyes: Negative for photophobia and visual disturbance.  Respiratory: Negative for cough, chest tightness, shortness of breath, wheezing and stridor.   Cardiovascular: Negative for chest pain, palpitations and leg swelling.  Gastrointestinal: Negative for abdominal distention, abdominal pain, anal bleeding, blood in stool, constipation, diarrhea, nausea and vomiting.  Genitourinary: Negative for difficulty urinating, dysuria, flank pain and hematuria.  Musculoskeletal: Negative for arthralgias, back pain, gait problem, joint swelling and myalgias.  Skin: Positive for wound. Negative for color change, pallor and rash.  Neurological: Negative for dizziness, tremors, weakness and light-headedness.  Hematological: Negative for adenopathy. Does not bruise/bleed easily.  Psychiatric/Behavioral: Negative for agitation, behavioral problems, confusion, decreased concentration, dysphoric mood and sleep disturbance.       Objective:   Physical Exam  Constitutional: She is oriented to person, place, and time. She appears well-developed and well-nourished. No distress.  HENT:  Head: Normocephalic and atraumatic.  Mouth/Throat: No oropharyngeal  exudate.  Eyes: Conjunctivae and EOM are normal. No scleral icterus.  Neck: Normal range of motion. Neck supple.  Cardiovascular: Normal rate and regular rhythm.   Pulmonary/Chest: Effort normal. No respiratory distress. She has no wheezes.  Abdominal: She exhibits no distension.  Musculoskeletal: She exhibits no edema or tenderness.  Neurological: She is alert and oriented to person, place, and time. She exhibits normal muscle tone. Coordination normal.  Skin: Skin is warm and dry. No rash noted. She is not diaphoretic. No erythema. No pallor.     Psychiatric: She has a normal mood and affect. Her behavior is normal. Judgment and thought content normal.          Assessment & Plan:    MSSA osteomyelitis associated with hardware sp removal of hardware (on lateral side not medial side where there is still hardware)  Repeat labs today if they're encouraging DC her doxycycline and see her in 3 months  time.

## 2016-07-03 ENCOUNTER — Telehealth: Payer: Self-pay | Admitting: *Deleted

## 2016-07-03 LAB — SEDIMENTATION RATE: SED RATE: 8 mm/h (ref 0–30)

## 2016-07-03 NOTE — Telephone Encounter (Signed)
-----   Message from Randall Hiss, MD sent at 07/03/2016 12:52 PM EDT ----- Patient can stop her antibiotics !

## 2016-07-03 NOTE — Telephone Encounter (Signed)
Relayed results to patient. Thank  You! Andree Coss, RN

## 2016-07-14 ENCOUNTER — Encounter: Payer: Self-pay | Admitting: Internal Medicine

## 2016-07-14 ENCOUNTER — Ambulatory Visit (INDEPENDENT_AMBULATORY_CARE_PROVIDER_SITE_OTHER): Payer: Medicare Other | Admitting: Internal Medicine

## 2016-07-14 VITALS — BP 120/74 | HR 61 | Ht 65.0 in | Wt 176.6 lb

## 2016-07-14 DIAGNOSIS — G4700 Insomnia, unspecified: Secondary | ICD-10-CM | POA: Diagnosis not present

## 2016-07-14 DIAGNOSIS — J302 Other seasonal allergic rhinitis: Secondary | ICD-10-CM

## 2016-07-14 DIAGNOSIS — J309 Allergic rhinitis, unspecified: Secondary | ICD-10-CM | POA: Diagnosis not present

## 2016-07-14 DIAGNOSIS — J45998 Other asthma: Secondary | ICD-10-CM | POA: Diagnosis not present

## 2016-07-14 DIAGNOSIS — J3089 Other allergic rhinitis: Secondary | ICD-10-CM

## 2016-07-14 MED ORDER — FLUTICASONE PROPIONATE HFA 110 MCG/ACT IN AERO: 2.0000 | INHALATION_SPRAY | Freq: Two times a day (BID) | RESPIRATORY_TRACT | 3 refills | Status: DC

## 2016-07-14 MED ORDER — ZALEPLON 5 MG PO CAPS: ORAL_CAPSULE | ORAL | 5 refills | Status: DC

## 2016-07-14 MED ORDER — ALBUTEROL SULFATE HFA 108 (90 BASE) MCG/ACT IN AERS: 2.0000 | INHALATION_SPRAY | Freq: Four times a day (QID) | RESPIRATORY_TRACT | 99 refills | Status: DC | PRN

## 2016-07-14 NOTE — Patient Instructions (Addendum)
Refill scripts printed for rescue inhaler and flovent  Script printed for Sonata 5 mg caps. Try 1 or 2 if needed for sleep. Can take at bedtime, or as late as 3 or 4 hours before you intend to get up.  Primary physician may be comfortable refilling these meds as long as you are stable

## 2016-07-14 NOTE — Progress Notes (Signed)
Subjective:    Patient ID: Cathy [name], female    DOB: 10-09-1941, 75 y.o.   MRN: [id number]  HPI   06/06/14- 53 yoF never smoker followed for allergic rhinitis, asthma, complicated by hx PAT FOLLOWS FOR: still on Allergy vaccine 1:10-gets through PCP Dr Majel Homer in [place].  She reports allergy vaccine does help her and there've been no problems. Still complains of dyspnea on exertion climbing stairs but describes walking 2 miles without problems. Not anemic. Stress test result reported "good". Not sensitive to weather change. Little benefit from rescue inhaler but continues Qvar and has Proventil.  07/13/15-  [name] never smoker followed for allergic rhinitis, asthma, complicated by hx PAT FOLLOWS FOR: still on Allergy vaccine 1:10-gets through PCP Dr Majel Homer in [place] Rare need for albuterol rescue inhaler but continues Qvar 40 daily. No recent PAT/tachycardia We discussed increasing the interval between her allergy vaccine injections to every other week. She may feel comfortable stopping in another year.  07/14/2016-75 year old female never smoker followed for allergic rhinitis, asthma, Insomnia, complicated by history PAT Allergy Vaccine 1:10- DC'd last year. FOLLOWS FOR:Pt states her breathing and allergies are doing well. Continues to wake 3-4 times during the night and takes about 1 hour to fall asleep. Insomnia and waking after sleep onset remain issues. Occasional snore. Denies daytime sleepiness. Little nocturia and not aware of limb jerks. Denies asthma symptoms rhinitis even in spring pollen season this year. Rare use of rescue inhaler every few months. Continues Flovent.  ROS-see HPI Constitutional:   No-   weight loss, night sweats, fevers, chills, fatigue, lassitude. HEENT:   No-  headaches, difficulty swallowing, tooth/dental problems, sore throat,       No-  sneezing, itching, ear ache, nasal congestion, post nasal drip,  CV:  No-   chest pain,  orthopnea, PND, swelling in lower extremities, anasarca, dizziness, palpitations Resp: +shortness of breath with exertion or at rest.              No-   productive cough,   non-productive cough,  No- coughing up of blood.              No-   change in color of mucus.  No- wheezing.   Skin: No-   rash or lesions. GI:  No-   heartburn, indigestion, abdominal pain, nausea, vomiting,                 GU:. MS:  +  joint pain or swelling.  Neuro-     nothing unusual Psych:  No- change in mood or affect. No depression or anxiety.  No memory loss.  OBJ- Physical Exam  exam similar to previous General- Alert, Oriented, Affect-appropriate, Distress- none acute Skin- rash-none, lesions- none, excoriation- none Lymphadenopathy- none Head- atraumatic            Eyes- Gross vision intact, PERRLA, conjunctivae and secretions clear            Ears- Hearing, canals-normal            Nose- Clear, no-Septal dev, mucus, polyps, erosion, perforation             Throat- Mallampati II , mucosa clear , drainage- none, tonsils- atrophic Neck- flexible , trachea midline, no stridor , thyroid nl, carotid no bruit Chest - symmetrical excursion , unlabored           Heart/CV- RRR , no murmur , no gallop  , no rub, nl s1 s2                           -  JVD- none , edema- none, stasis changes- none, varices- none           Lung- clear to P&A, wheeze- none, cough- none , dullness-none, rub- none           Chest wall-  Abd- Br/ Gen/ Rectal- Not done, not indicated Extrem- cyanosis- none, clubbing, none, atrophy- none, strength- nl. + Walking boot left foot Neuro- grossly intact to observation and

## 2016-07-27 NOTE — Assessment & Plan Note (Addendum)
We reviewed basic sleep hygiene again and discussed appropriate control of occasional sleep medication. Plan-Sonata chosen for short half-life

## 2016-07-27 NOTE — Assessment & Plan Note (Signed)
She did well this spring off of allergy vaccine. OTC antihistamine and nasal steroid spray should be sufficient if she has problems.

## 2016-07-27 NOTE — Assessment & Plan Note (Signed)
Mild intermittent uncomplicated asthma well-controlled

## 2016-08-07 ENCOUNTER — Telehealth: Payer: Self-pay

## 2016-08-07 NOTE — Telephone Encounter (Signed)
Received PA request from [facility]. PA pending thru [facility].  Key:[id number]

## 2016-08-08 NOTE — Telephone Encounter (Signed)
PA approved for Zaleplon 5mg  from 08/07/16-08/07/17. Both pt and pharmacy made aware. Nothing further needed.

## 2016-08-13 ENCOUNTER — Other Ambulatory Visit: Payer: Self-pay | Admitting: Internal Medicine

## 2016-08-13 DIAGNOSIS — Z1231 Encounter for screening mammogram for malignant neoplasm of breast: Secondary | ICD-10-CM

## 2016-09-01 ENCOUNTER — Ambulatory Visit: Payer: Medicare Other

## 2016-09-04 ENCOUNTER — Ambulatory Visit
Admission: RE | Admit: 2016-09-04 | Discharge: 2016-09-04 | Disposition: A | Discharge status: Home / Self Care | Payer: Medicare Other | Source: Ambulatory Visit | Attending: Internal Medicine | Admitting: Internal Medicine

## 2016-09-04 DIAGNOSIS — Z1231 Encounter for screening mammogram for malignant neoplasm of breast: Secondary | ICD-10-CM

## 2016-10-01 ENCOUNTER — Ambulatory Visit: Payer: Medicare Other | Admitting: Infectious Disease

## 2016-11-11 ENCOUNTER — Encounter: Payer: Self-pay | Admitting: Infectious Disease

## 2016-11-11 ENCOUNTER — Ambulatory Visit (INDEPENDENT_AMBULATORY_CARE_PROVIDER_SITE_OTHER): Payer: Medicare Other | Admitting: Infectious Disease

## 2016-11-11 VITALS — BP 137/72 | HR 88 | Temp 97.2°F | Wt 174.0 lb

## 2016-11-11 DIAGNOSIS — T814XXD Infection following a procedure, subsequent encounter: Secondary | ICD-10-CM

## 2016-11-11 DIAGNOSIS — A4901 Methicillin susceptible Staphylococcus aureus infection, unspecified site: Secondary | ICD-10-CM | POA: Diagnosis not present

## 2016-11-11 DIAGNOSIS — T847XXD Infection and inflammatory reaction due to other internal orthopedic prosthetic devices, implants and grafts, subsequent encounter: Secondary | ICD-10-CM

## 2016-11-11 DIAGNOSIS — M86172 Other acute osteomyelitis, left ankle and foot: Secondary | ICD-10-CM | POA: Diagnosis present

## 2016-11-11 DIAGNOSIS — IMO0001 Reserved for inherently not codable concepts without codable children: Secondary | ICD-10-CM

## 2016-11-11 LAB — BASIC METABOLIC PANEL WITH GFR
BUN: 18 mg/dL (ref 7–25)
CO2: 25 mmol/L (ref 20–31)
Calcium: 8.9 mg/dL (ref 8.6–10.4)
Chloride: 105 mmol/L (ref 98–110)
Creat: 0.97 mg/dL — ABNORMAL HIGH (ref 0.60–0.93)
GFR, EST AFRICAN AMERICAN: 67 mL/min (ref >=60)
GFR, EST NON AFRICAN AMERICAN: 58 mL/min — AB (ref >=60)
GLUCOSE: 126 mg/dL — AB (ref 65–99)
POTASSIUM: 3.9 mmol/L (ref 3.5–5.3)
Sodium: 139 mmol/L (ref 135–146)

## 2016-11-11 NOTE — Progress Notes (Signed)
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   MRN: [id number]  HPI   75 y.o. 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 She states that the pain at her operative site is greatly improved. She had been following with Dr. Victorino DikeHewitt closely and he has action now "released her.  We had changed her from cefazolin to oral Keflex but then she developed a diffuse body rash and we change her to oral doxycycline.  When I saw her in July we checked her inflammatory markers and took her off doxycycline she has done very well since then with no evidence of recurrent infection.    Past Medical History:  Diagnosis Date  . Allergic rhinitis, cause unspecified   . Paroxysmal supraventricular tachycardia (HCC)   . Unspecified asthma(493.90)   . Wound infection after surgery 03/2016    Past Surgical History:  Procedure Laterality Date  . ANKLE FRACTURE SURGERY Left 12/2015  . APPENDECTOMY    . BILATERAL SALPINGOOPHORECTOMY    . I&D EXTREMITY N/A 03/12/2016   Procedure: IRRIGATION AND DEBRIDEMENT EXTREMITY;  Surgeon: Toni ArthursJohn Hewitt, MD;  Location: [facility] OR;  Service: Orthopedics;  Laterality: N/A;  . squamous cell cancer face    . TOE SURGERY    . TONSILLECTOMY    . TOTAL ABDOMINAL HYSTERECTOMY    . vocal cord nodules      No family history on file.    Social History   Social History  . Marital status: Married    Spouse name: N/A  . Number of children: N/A  . Years of education: N/A   Occupational History  . [employer] operational officersecretary    Social History Main Topics  . Smoking status: Never Smoker  . Smokeless tobacco: Never Used  . Alcohol use No  . Drug use: No  . Sexual activity: Not on file   Other Topics Concern  .  Not on file   Social History Narrative  . No narrative on file    Allergies  Allergen Reactions  . Keflex [Cephalexin] Hives  . Penicillins Rash  . Sulfonamide Derivatives Rash     Current Outpatient Prescriptions:  .  albuterol (PROVENTIL HFA) 108 (90 Base) MCG/ACT inhaler, Inhale 2 puffs into the lungs every 6 (six) hours as needed for wheezing or shortness of breath., Disp: 1 Inhaler, Rfl: prn .  alendronate (FOSAMAX) 35 MG tablet, Take 1 tablet by mouth Once a week., Disp: , Rfl:  .  atorvastatin (LIPITOR) 10 MG tablet, Take 10 mg by mouth daily., Disp: , Rfl:  .  Calcium Carbonate (CALTRATE 600) 1500 MG TABS, Take by mouth daily. Reported on 04/14/2016, Disp: , Rfl:  .  etodolac (LODINE) 500 MG tablet, Take 1 tablet by mouth 2 (two) times daily., Disp: , Rfl:  .  fluticasone (FLONASE) 50 MCG/ACT nasal spray, Place 2 sprays into the nose every evening., Disp: , Rfl:  .  fluticasone (FLOVENT HFA) 110 MCG/ACT inhaler, Inhale 2 puffs into the lungs 2 (two) times daily., Disp: 3 Inhaler, Rfl: 3 .  KRILL OIL 1000 MG CAPS, Take 1 capsule by mouth daily. Reported on 04/14/2016, Disp: , Rfl:  .  Magnesium 500 MG TABS, Take 1,000 mg by mouth daily., Disp: ,  Rfl:  .  Multiple Vitamin (MULTIVITAMIN) tablet, Take 1 tablet by mouth daily.  , Disp: , Rfl:  .  pantoprazole (PROTONIX) 40 MG tablet, Take 40 mg by mouth daily., Disp: , Rfl:  .  pindolol (VISKEN) 5 MG tablet, Take 5 mg by mouth daily.  , Disp: , Rfl:  .  Vitamin D, Ergocalciferol, (DRISDOL) 50000 units CAPS capsule, Take 1 capsule by mouth once a week., Disp: , Rfl:  .  zaleplon (SONATA) 5 MG capsule, 1 or 2 as needed for sleep, Disp: 30 capsule, Rfl: 5   Review of Systems  Constitutional: Negative for activity change, appetite change, chills, diaphoresis, fatigue, fever and unexpected weight change.  HENT: Negative for congestion, rhinorrhea, sinus pressure, sneezing, sore throat and trouble swallowing.   Eyes: Negative for  photophobia and visual disturbance.  Respiratory: Negative for cough, chest tightness, shortness of breath, wheezing and stridor.   Cardiovascular: Negative for chest pain, palpitations and leg swelling.  Gastrointestinal: Negative for abdominal distention, abdominal pain, anal bleeding, blood in stool, constipation, diarrhea, nausea and vomiting.  Genitourinary: Negative for difficulty urinating, dysuria, flank pain and hematuria.  Musculoskeletal: Negative for arthralgias, back pain, gait problem, joint swelling and myalgias.  Skin: Positive for wound. Negative for color change, pallor and rash.  Neurological: Negative for dizziness, tremors, weakness and light-headedness.  Hematological: Negative for adenopathy. Does not bruise/bleed easily.  Psychiatric/Behavioral: Negative for agitation, behavioral problems, confusion, decreased concentration, dysphoric mood and sleep disturbance.       Objective:   Physical Exam  Constitutional: She is oriented to person, place, and time. She appears well-developed and well-nourished. No distress.  HENT:  Head: Normocephalic and atraumatic.  Mouth/Throat: No oropharyngeal exudate.  Eyes: Conjunctivae and EOM are normal. No scleral icterus.  Neck: Normal range of motion. Neck supple.  Cardiovascular: Normal rate and regular rhythm.   Pulmonary/Chest: Effort normal. No respiratory distress. She has no wheezes.  Abdominal: She exhibits no distension.  Musculoskeletal: She exhibits no edema or tenderness.  Neurological: She is alert and oriented to person, place, and time. She exhibits normal muscle tone. Coordination normal.  Skin: Skin is warm and dry. No rash noted. She is not diaphoretic. No erythema. No pallor.     Psychiatric: She has a normal mood and affect. Her behavior is normal. Judgment and thought content normal.    11/11/16:           Assessment & Plan:    MSSA osteomyelitis associated with hardware sp removal of hardware  (on lateral side not medial side where there is still hardware)  Done very well without antibiotics since July. We'll check inflammatory markers today and they are reassuring she can come back to [facility] as needed

## 2016-11-12 LAB — SEDIMENTATION RATE: SED RATE: 9 mm/h (ref 0–30)

## 2016-11-12 LAB — C-REACTIVE PROTEIN: CRP: 1.8 mg/L (ref <8.0)

## 2017-08-12 ENCOUNTER — Other Ambulatory Visit: Payer: Self-pay | Admitting: Internal Medicine

## 2017-08-12 DIAGNOSIS — Z1231 Encounter for screening mammogram for malignant neoplasm of breast: Secondary | ICD-10-CM

## 2017-09-09 ENCOUNTER — Ambulatory Visit
Admission: RE | Admit: 2017-09-09 | Discharge: 2017-09-09 | Disposition: A | Discharge status: Home / Self Care | Payer: Medicare Other | Source: Ambulatory Visit | Attending: Internal Medicine | Admitting: Internal Medicine

## 2017-09-09 DIAGNOSIS — Z1231 Encounter for screening mammogram for malignant neoplasm of breast: Secondary | ICD-10-CM

## 2018-01-05 ENCOUNTER — Encounter: Payer: Self-pay | Admitting: Internal Medicine

## 2018-01-08 DIAGNOSIS — H269 Unspecified cataract: Secondary | ICD-10-CM

## 2018-01-08 HISTORY — DX: Unspecified cataract: H26.9

## 2018-01-19 ENCOUNTER — Ambulatory Visit: Payer: Medicare Other | Admitting: Physician Assistant

## 2018-03-15 ENCOUNTER — Ambulatory Visit: Payer: Medicare Other | Admitting: Internal Medicine

## 2018-08-30 ENCOUNTER — Other Ambulatory Visit: Payer: Self-pay | Admitting: Internal Medicine

## 2018-08-30 DIAGNOSIS — Z1231 Encounter for screening mammogram for malignant neoplasm of breast: Secondary | ICD-10-CM

## 2018-08-31 ENCOUNTER — Ambulatory Visit (INDEPENDENT_AMBULATORY_CARE_PROVIDER_SITE_OTHER): Payer: Medicare Other | Admitting: Internal Medicine

## 2018-08-31 ENCOUNTER — Other Ambulatory Visit: Payer: Self-pay | Admitting: Internal Medicine

## 2018-08-31 ENCOUNTER — Encounter: Payer: Self-pay | Admitting: Internal Medicine

## 2018-08-31 VITALS — BP 124/62 | HR 74 | Ht 66.5 in | Wt 161.0 lb

## 2018-08-31 DIAGNOSIS — R1013 Epigastric pain: Secondary | ICD-10-CM | POA: Diagnosis not present

## 2018-08-31 DIAGNOSIS — R1011 Right upper quadrant pain: Secondary | ICD-10-CM | POA: Diagnosis not present

## 2018-08-31 DIAGNOSIS — Z8619 Personal history of other infectious and parasitic diseases: Secondary | ICD-10-CM | POA: Diagnosis not present

## 2018-08-31 DIAGNOSIS — Z86010 Personal history of colonic polyps: Secondary | ICD-10-CM

## 2018-08-31 MED ORDER — PANTOPRAZOLE SODIUM 40 MG PO TBEC: 40.0000 mg | DELAYED_RELEASE_TABLET | Freq: Every day | ORAL | 2 refills | Status: DC

## 2018-08-31 MED ORDER — DICYCLOMINE HCL 20 MG PO TABS: 20.0000 mg | ORAL_TABLET | Freq: Three times a day (TID) | ORAL | 2 refills | Status: DC | PRN

## 2018-08-31 NOTE — Progress Notes (Signed)
Patient ID: Cathy [name], female   DOB: 01/22/1941, 77 y.o.   MRN: [id number] HPI: Cathy [name] is a 77 year old female with a past medical history of H. pylori gastritis status post treatment, adenomatous colon polyp, borderline gallbladder function, hyperlipidemia who is seen to evaluate epigastric and right upper quadrant abdominal pain.  She is here alone today.  She has a GI history and I have reviewed these records today.  Dr. Mikal PlaneBrian Beacham has performed upper endoscopy and colonoscopy in late 2018 and earlier this year.  EGD was performed on 11/03/2017 to evaluate GERD right upper quadrant and epigastric pain.  Findings showed fundic gland polyp removed with hot snare.  And the exam was otherwise unremarkable.  Small hiatal hernia.  Pathology from the gastric polyp in the fundus showed fundic gland polyp with H. pylori gastritis.  Antral biopsies showed chronic active gastritis negative for H. Pylori.  Her colonoscopy was performed on 02/17/2018 and showed transverse colon polyp, 5 mm removed with hot snare.  Found to be a tubular adenoma.  Scattered diverticula and internal hemorrhoids.  She reports that she was treated with antibiotics, she does not recall which regimen after H. pylori diagnosis.  She reports for 2-1/2 nearly 3 months her epigastric abdominal pain had resolved.  She is now had recurrent symptoms very reminiscent of her symptoms at the time of H. pylori diagnosis.  This is epigastric abdominal and mid abdominal crampy pain worse with eating.  Associated with nausea but not vomiting.  She is taking pantoprazole 40 mg daily and dicyclomine 10 mg 2-3 times daily.  Initially dicyclomine was helpful but it is now no longer as effective.  Separate from her epigastric pain she will have more episodic right upper quadrant pain without definitive dietary trigger.  She is concerned that this could be her gallbladder.  It radiates to her right shoulder blade.  It can happen is frequently as  once per day.  She recalls having an ultrasound which did not show stones within the last 8 months.  She then had a HIDA scan and was told her gallbladder function was "borderline".  Her bowel habits have been more constipated of late and she is been using MiraLAX 17 g daily.  This is caused small soft bowel movements occurring 4-5 times per day.  She reports every time she urinates she will have a small bowel movement.  She is been cutting back on the dose a little.  Past Medical History:  Diagnosis Date  . Allergic rhinitis, cause unspecified   . Asthma   . Hyperlipidemia   . Paroxysmal supraventricular tachycardia (HCC)   . Wound infection after surgery 03/2016    Past Surgical History:  Procedure Laterality Date  . ANKLE FRACTURE SURGERY Left 12/2015  . APPENDECTOMY    . BILATERAL SALPINGOOPHORECTOMY    . I&D EXTREMITY N/A 03/12/2016   Procedure: IRRIGATION AND DEBRIDEMENT EXTREMITY;  Surgeon: Toni ArthursJohn Hewitt, MD;  Location: [facility] OR;  Service: Orthopedics;  Laterality: N/A;  . squamous cell cancer face    . TOE SURGERY    . TONSILLECTOMY    . TOTAL ABDOMINAL HYSTERECTOMY    . vocal cord nodules      Outpatient Medications Prior to Visit  Medication Sig Dispense Refill  . Calcium Carbonate (CALTRATE 600) 1500 MG TABS Take by mouth daily. Reported on 04/14/2016    . fluticasone (FLONASE) 50 MCG/ACT nasal spray Place 2 sprays into the nose every evening.    . fluticasone (FLOVENT HFA) 110  MCG/ACT inhaler Inhale 2 puffs into the lungs 2 (two) times daily. 3 Inhaler 3  . KRILL OIL 1000 MG CAPS Take 1 capsule by mouth daily. Reported on 04/14/2016    . Magnesium 500 MG TABS Take 1,000 mg by mouth daily.    . montelukast (SINGULAIR) 10 MG tablet Take 10 mg by mouth at bedtime.    . Multiple Vitamin (MULTIVITAMIN) tablet Take 1 tablet by mouth daily.      . Multiple Vitamins-Minerals (PRESERVISION AREDS 2) CAPS Take 2 capsules by mouth daily.    . pindolol (VISKEN) 5 MG tablet Take 5 mg by mouth  daily.      . polyethylene glycol (MIRALAX / GLYCOLAX) packet Take 17 g by mouth every other day.    . Probiotic Product (PROBIOTIC-10) CAPS Take 1 capsule by mouth daily.    . Vitamin D, Ergocalciferol, (DRISDOL) 50000 units CAPS capsule Take 1 capsule by mouth once a week.    . dicyclomine (BENTYL) 10 MG capsule Take 10 mg by mouth as needed for spasms.    . pantoprazole (PROTONIX) 40 MG tablet Take 40 mg by mouth daily.    Marland Kitchen albuterol (PROVENTIL HFA) 108 (90 Base) MCG/ACT inhaler Inhale 2 puffs into the lungs every 6 (six) hours as needed for wheezing or shortness of breath. 1 Inhaler prn  . alendronate (FOSAMAX) 35 MG tablet Take 1 tablet by mouth Once a week.    Marland Kitchen atorvastatin (LIPITOR) 10 MG tablet Take 10 mg by mouth daily.    Marland Kitchen etodolac (LODINE) 500 MG tablet Take 1 tablet by mouth 2 (two) times daily.    . zaleplon (SONATA) 5 MG capsule 1 or 2 as needed for sleep 30 capsule 5   No facility-administered medications prior to visit.     Allergies  Allergen Reactions  . Keflex [Cephalexin] Hives  . Penicillins Rash  . Sulfonamide Derivatives Rash    Family History  Problem Relation Age of Onset  . Heart disease Father     Social History   Tobacco Use  . Smoking status: Never Smoker  . Smokeless tobacco: Never Used  Substance Use Topics  . Alcohol use: No    Alcohol/week: 0.0 standard drinks  . Drug use: No    ROS: As per history of present illness, otherwise negative  BP 124/62   Pulse 74   Ht 5' 6.5" (1.689 m)   Wt 161 lb (73 kg)   BMI 25.60 kg/m  Constitutional: Well-developed and well-nourished. No distress. HEENT: Normocephalic and atraumatic. Oropharynx is clear and moist. Conjunctivae are normal.  No scleral icterus. Neck: Neck supple. Trachea midline. Cardiovascular: Normal rate, regular rhythm and intact distal pulses. No M/R/G Pulmonary/chest: Effort normal and breath sounds normal. No wheezing, rales or rhonchi. Abdominal: Soft, epigastric and right  upper quadrant tenderness without rebound or guarding, nondistended abdomen  Bowel sounds active throughout. There are no masses palpable. No hepatosplenomegaly. Extremities: no clubbing, cyanosis, or edema Neurological: Alert and oriented to person place and time. Skin: Skin is warm and dry.  Psychiatric: Normal mood and affect. Behavior is normal.  RELEVANT LABS AND IMAGING: 10/21/2017  3:07 PM EST EXAM: CT of the abdomen and pelvis with 90 mL Omnipaque IV contrast.  One or more of the following radiation dose techniques were used for this examination: automated exposure control, adjustment of the mA and/or kV according to patient size, use of iterative reconstruction technique.  COMPARISON: No similar studies available.  FINDINGS:   The lung bases  are clear of an acute process. Scattered fibronodular changes are noted in the lower lung fields and lingula.  The solid upper abdominal organs are normal in configuration. There is no demonstrable focal mass.  There is no hydronephrosis or stone identified.    There is no bowel obstruction or abnormal bowel wall thickening. No gross free air. The appendix is not identified with certainty, but there are no secondary signs of appendicitis in the right lower quadrant.  Calcific atherosclerotic disease of the aorta is present.  No pelvic mass, adenopathy, or significant free pelvic fluid.  No acute osseous findings. There is multilevel spondylosis and facet arthrosis.  No suspicious subcutaneous soft tissue findings.   IMPRESSION:   No acute CT findings to explain the patient's right upper quadrant pain with certainty.  Additional Findings That May Require Follow-Up: None beyond what is potentially described above.   HIDA scan not seen in CareEverywhere   ASSESSMENT/PLAN: 77 year old female with a past medical history of H. pylori gastritis status post treatment, adenomatous colon polyp, borderline gallbladder function,  hyperlipidemia who is seen to evaluate epigastric and right upper quadrant abdominal pain  1.  Epigastric pain/history of H. Pylori --it is possible that her H. pylori was incompletely eradicated or has recurred.  Given reminiscent symptoms despite daily PPI I have recommended we repeat upper endoscopy for exclusion of persistent H. pylori.  We will increase Bentyl to 20 mg 3 times daily as needed until endoscopy.  2.  Right upper quadrant pain --could be related to #1 but also could be related to biliary dysfunction.  Before consideration of repeat HIDA scan or surgical referral for cholecystectomy I recommend we exclude H. pylori as discussed in #1.  Low-fat diet recommended  3.  Constipation/incomplete evacuation --MiraLAX has led to soft more frequent stools.  We will have her reduce MiraLAX to every other day at 17 g and begin Benefiber 1 to 2 tablespoons daily.  Hopefully Benefiber will help bulk stools and lead to more complete and effective evacuation.  4.  History of adenomatous colon polyps --single adenoma earlier this year; surveillance recall interval 5 years

## 2018-08-31 NOTE — Patient Instructions (Addendum)
You have been scheduled for an endoscopy. Please follow written instructions given to you at your visit today. If you use inhalers (even only as needed), please bring them with you on the day of your procedure. Your physician has requested that you go to www.startemmi.com and enter the access code given to you at your visit today. This web site gives a general overview about your procedure. However, you should still follow specific instructions given to you by our office regarding your preparation for the procedure.  We have sent the following medications to your pharmacy for you to pick up at your convenience: Pantoprazole 40 mg daily Bentyl 20 mg three times daily as needed (increase from your previous dosing)  Please decrease your miralax to 17 grams every other day.  Please purchase the following medications over the counter and take as directed: Benefiber 2 tablespoons daily  If you are age 75 or older, your body mass index should be between 23-30. Your Body mass index is 25.6 kg/m. If this is out of the aforementioned range listed, please consider follow up with your Primary Care Provider.  If you are age 75 or younger, your body mass index should be between 19-25. Your Body mass index is 25.6 kg/m. If this is out of the aformentioned range listed, please consider follow up with your Primary Care Provider.

## 2018-09-27 ENCOUNTER — Ambulatory Visit
Admission: RE | Admit: 2018-09-27 | Discharge: 2018-09-27 | Disposition: A | Discharge status: Home / Self Care | Payer: Medicare Other | Source: Ambulatory Visit | Attending: Internal Medicine | Admitting: Internal Medicine

## 2018-09-27 DIAGNOSIS — Z1231 Encounter for screening mammogram for malignant neoplasm of breast: Secondary | ICD-10-CM

## 2018-10-11 ENCOUNTER — Ambulatory Visit (AMBULATORY_SURGERY_CENTER): Payer: Medicare Other | Admitting: Internal Medicine

## 2018-10-11 ENCOUNTER — Encounter: Payer: Self-pay | Admitting: Internal Medicine

## 2018-10-11 VITALS — BP 138/64 | HR 81 | Temp 96.9°F | Resp 11 | Ht 66.0 in | Wt 161.0 lb

## 2018-10-11 DIAGNOSIS — K2970 Gastritis, unspecified, without bleeding: Secondary | ICD-10-CM | POA: Diagnosis not present

## 2018-10-11 DIAGNOSIS — K2950 Unspecified chronic gastritis without bleeding: Secondary | ICD-10-CM

## 2018-10-11 DIAGNOSIS — K449 Diaphragmatic hernia without obstruction or gangrene: Secondary | ICD-10-CM | POA: Diagnosis not present

## 2018-10-11 DIAGNOSIS — B9681 Helicobacter pylori [H. pylori] as the cause of diseases classified elsewhere: Secondary | ICD-10-CM | POA: Diagnosis not present

## 2018-10-11 DIAGNOSIS — R1013 Epigastric pain: Secondary | ICD-10-CM

## 2018-10-11 MED ORDER — SODIUM CHLORIDE 0.9 % IV SOLN
500.0000 mL | Freq: Once | INTRAVENOUS | Status: DC
Start: 2018-10-11 — End: 2018-10-11

## 2018-10-11 NOTE — Progress Notes (Signed)
Alert and oriented x3, pleased with MAC, report to RN  

## 2018-10-11 NOTE — Progress Notes (Signed)
Called to room to assist during endoscopic procedure.  Patient ID and intended procedure confirmed with present staff. Received instructions for my participation in the procedure from the performing physician.  

## 2018-10-11 NOTE — Op Note (Signed)
[facility] Patient Name: Cathy [name] Procedure Date: 10/11/2018 9:39 AM MRN: [id number] Endoscopist: Beverley Fiedler , MD Age: 77 Referring MD:  Date of Birth: 08/24/1941 Gender: Female Account #: [id number] Procedure:                Upper GI endoscopy Indications:              Epigastric abdominal pain, history of H. Pylori                            infection Medicines:                Monitored Anesthesia Care Procedure:                Pre-Anesthesia Assessment:                           - Prior to the procedure, a History and Physical                            was performed, and patient medications and                            allergies were reviewed. The patient's tolerance of                            previous anesthesia was also reviewed. The risks                            and benefits of the procedure and the sedation                            options and risks were discussed with the patient.                            All questions were answered, and informed consent                            was obtained. Prior Anticoagulants: The patient has                            taken no previous anticoagulant or antiplatelet                            agents. ASA Grade Assessment: II - A patient with                            mild systemic disease. After reviewing the risks                            and benefits, the patient was deemed in                            satisfactory condition to undergo the procedure.  After obtaining informed consent, the endoscope was                            passed under direct vision. Throughout the                            procedure, the patient's blood pressure, pulse, and                            oxygen saturations were monitored continuously. The                            Endoscope was introduced through the mouth, and                            advanced to the second part of duodenum. The upper                             GI endoscopy was accomplished without difficulty.                            The patient tolerated the procedure well. Scope In: Scope Out: Findings:                 Normal mucosa was found in the entire esophagus.                           A 1 cm hiatal hernia was present.                           Diffuse mild inflammation characterized by erythema                            was found in the gastric fundus and in the gastric                            body. Biopsies were taken with a cold forceps for                            histology and Helicobacter pylori testing.                           The examined duodenum was normal. Complications:            No immediate complications. Estimated Blood Loss:     Estimated blood loss was minimal. Impression:               - Normal mucosa was found in the entire esophagus.                           - 1 cm hiatal hernia.                           - Mild gastritis. Biopsied.                           -  Normal examined duodenum. Recommendation:           - Patient has a contact number available for                            emergencies. The signs and symptoms of potential                            delayed complications were discussed with the                            patient. Return to normal activities tomorrow.                            Written discharge instructions were provided to the                            patient.                           - Resume previous diet.                           - Continue present medications.                           - Await pathology results.                           - If pathology results unrevealing and continued                            epigastric or right upper quadrant pain consider                            [facility] HIDA scan Beverley Fiedler, MD 10/11/2018 10:11:06 AM This report has been signed electronically.

## 2018-10-11 NOTE — Patient Instructions (Signed)
HANDOUTS GIVEN FOR GASTRITIS AND HIATAL HERNIA  YOU HAD AN ENDOSCOPIC PROCEDURE TODAY AT THE [facility]:   Refer to the procedure report that was given to you for any specific questions about what was found during the examination.  If the procedure report does not answer your questions, please call your gastroenterologist to clarify.  If you requested that your care partner not be given the details of your procedure findings, then the procedure report has been included in a sealed envelope for you to review at your convenience later.  YOU SHOULD EXPECT: Some feelings of bloating in the abdomen. Passage of more gas than usual.  Walking can help get rid of the air that was put into your GI tract during the procedure and reduce the bloating. If you had a lower endoscopy (such as a colonoscopy or flexible sigmoidoscopy) you may notice spotting of blood in your stool or on the toilet paper. If you underwent a bowel prep for your procedure, you may not have a normal bowel movement for a few days.  Please Note:  You might notice some irritation and congestion in your nose or some drainage.  This is from the oxygen used during your procedure.  There is no need for concern and it should clear up in a day or so.  SYMPTOMS TO REPORT IMMEDIATELY:    Following upper endoscopy (EGD)  Vomiting of blood or coffee ground material  New chest pain or pain under the shoulder blades  Painful or persistently difficult swallowing  New shortness of breath  Fever of 100F or higher  Black, tarry-looking stools  For urgent or emergent issues, a gastroenterologist can be reached at any hour by calling [phone].   DIET:  We do recommend a small meal at first, but then you may proceed to your regular diet.  Drink plenty of fluids but you should avoid alcoholic beverages for 24 hours.  ACTIVITY:  You should plan to take it easy for the rest of today and you should NOT DRIVE or use heavy machinery  until tomorrow (because of the sedation medicines used during the test).    FOLLOW UP: Our staff will call the number listed on your records the next business day following your procedure to check on you and address any questions or concerns that you may have regarding the information given to you following your procedure. If we do not reach you, we will leave a message.  However, if you are feeling well and you are not experiencing any problems, there is no need to return our call.  We will assume that you have returned to your regular daily activities without incident.  If any biopsies were taken you will be contacted by phone or by letter within the next 1-3 weeks.  Please call us at [phone] if you have not heard about the biopsies in 3 weeks.    SIGNATURES/CONFIDENTIALITY: You and/or your care partner have signed paperwork which will be entered into your electronic medical record.  These signatures attest to the fact that that the information above on your After Visit Summary has been reviewed and is understood.  Full responsibility of the confidentiality of this discharge information lies with you and/or your care-partner.

## 2018-10-12 ENCOUNTER — Telehealth: Payer: Self-pay

## 2018-10-12 NOTE — Telephone Encounter (Signed)
  Follow up Call-  Call back number 10/11/2018  Post procedure Call Back phone  # [phone]  Permission to leave phone message Yes  Some recent data might be hidden     Patient questions:  Do you have a fever, pain , or abdominal swelling? No. Pain Score  0 *  Have you tolerated food without any problems? Yes.    Have you been able to return to your normal activities? Yes.    Do you have any questions about your discharge instructions: Diet   No. Medications  No. Follow up visit  No.  Do you have questions or concerns about your Care? No.  Actions: * If pain score is 4 or above: No action needed, pain <4.

## 2018-10-20 ENCOUNTER — Other Ambulatory Visit: Payer: Self-pay | Admitting: Internal Medicine

## 2018-10-20 ENCOUNTER — Other Ambulatory Visit: Payer: Self-pay

## 2018-10-20 MED ORDER — CLARITHROMYCIN 500 MG PO TABS: 500.0000 mg | ORAL_TABLET | Freq: Two times a day (BID) | ORAL | 0 refills | Status: DC

## 2018-10-20 MED ORDER — PANTOPRAZOLE SODIUM 40 MG PO TBEC: 40.0000 mg | DELAYED_RELEASE_TABLET | Freq: Two times a day (BID) | ORAL | 0 refills | Status: DC

## 2018-10-20 MED ORDER — METRONIDAZOLE 250 MG PO TABS: 250.0000 mg | ORAL_TABLET | Freq: Four times a day (QID) | ORAL | 0 refills | Status: DC

## 2018-10-27 ENCOUNTER — Telehealth: Payer: Self-pay | Admitting: Internal Medicine

## 2018-10-27 MED ORDER — FLUCONAZOLE 150 MG PO TABS: 150.0000 mg | ORAL_TABLET | Freq: Every day | ORAL | 1 refills | Status: DC

## 2018-10-27 NOTE — Telephone Encounter (Signed)
Pt aware, script sent to pharmacy. 

## 2018-10-27 NOTE — Telephone Encounter (Signed)
Pt prescribed antibiotics for H pylori and now has yeast infection. Requesting something for yeast infection. Please advise.

## 2018-10-27 NOTE — Telephone Encounter (Signed)
Ok for diflucan for vaginal yeast infection caused by antibiotics for H pylori 150 mg PO x 1, ok for one refill (treatment is single dose)

## 2018-11-22 ENCOUNTER — Other Ambulatory Visit: Payer: Self-pay

## 2018-11-22 ENCOUNTER — Telehealth: Payer: Self-pay | Admitting: Internal Medicine

## 2018-11-22 DIAGNOSIS — A048 Other specified bacterial intestinal infections: Secondary | ICD-10-CM

## 2018-11-22 NOTE — Telephone Encounter (Signed)
Discussed with pt that she needs to have finished the treatment for a month and hold protonix for 2 weeks prior to test. Discussed with  Pt that she does not need appt she may just go to the lab M-F between 7:30am-5pm, no appt needed.

## 2018-12-06 ENCOUNTER — Other Ambulatory Visit: Payer: Medicare Other

## 2018-12-06 DIAGNOSIS — A048 Other specified bacterial intestinal infections: Secondary | ICD-10-CM

## 2018-12-07 LAB — HELICOBACTER PYLORI  SPECIAL ANTIGEN
MICRO NUMBER: 91550963
SPECIMEN QUALITY: ADEQUATE

## 2018-12-16 ENCOUNTER — Other Ambulatory Visit: Payer: Self-pay | Admitting: Internal Medicine

## 2019-03-27 ENCOUNTER — Other Ambulatory Visit: Payer: Self-pay | Admitting: Internal Medicine

## 2019-08-26 ENCOUNTER — Other Ambulatory Visit: Payer: Self-pay | Admitting: Internal Medicine

## 2019-08-26 DIAGNOSIS — Z1231 Encounter for screening mammogram for malignant neoplasm of breast: Secondary | ICD-10-CM

## 2019-09-23 ENCOUNTER — Other Ambulatory Visit: Payer: Self-pay | Admitting: *Deleted

## 2019-09-23 MED ORDER — PANTOPRAZOLE SODIUM 40 MG PO TBEC: DELAYED_RELEASE_TABLET | ORAL | 1 refills | Status: DC

## 2019-10-11 ENCOUNTER — Ambulatory Visit: Payer: Medicare Other

## 2019-10-12 ENCOUNTER — Other Ambulatory Visit: Payer: Self-pay

## 2019-10-12 ENCOUNTER — Ambulatory Visit
Admission: RE | Admit: 2019-10-12 | Discharge: 2019-10-12 | Disposition: A | Discharge status: Home / Self Care | Payer: Medicare Other | Source: Ambulatory Visit | Attending: Internal Medicine | Admitting: Internal Medicine

## 2019-10-12 DIAGNOSIS — Z1231 Encounter for screening mammogram for malignant neoplasm of breast: Secondary | ICD-10-CM

## 2020-02-02 IMAGING — MG DIGITAL SCREENING BILATERAL MAMMOGRAM WITH TOMO AND CAD
6 of 10 series · 6 of 30 positions shown · non-contrast
Comparison: Previous exam(s).

CLINICAL DATA: Screening.

EXAM:
DIGITAL SCREENING BILATERAL MAMMOGRAM WITH TOMO AND CAD

[R MLO synth-2D (1 of 2)]
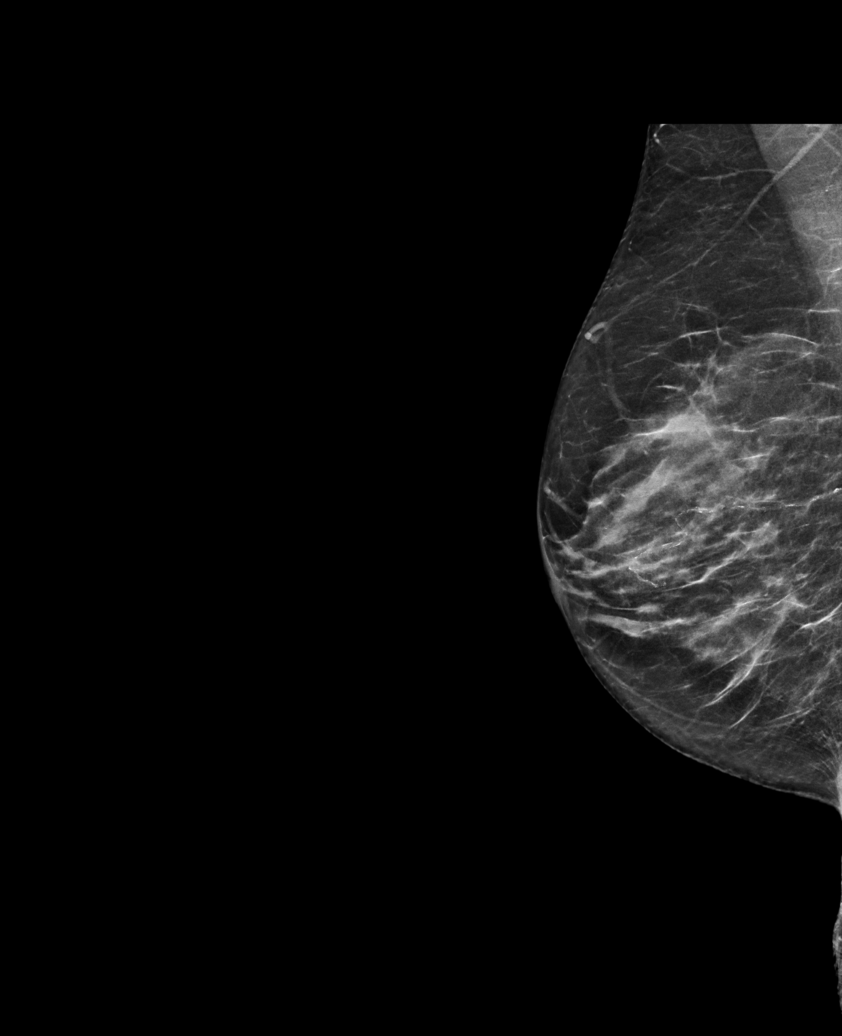

[L MLO synth-2D]
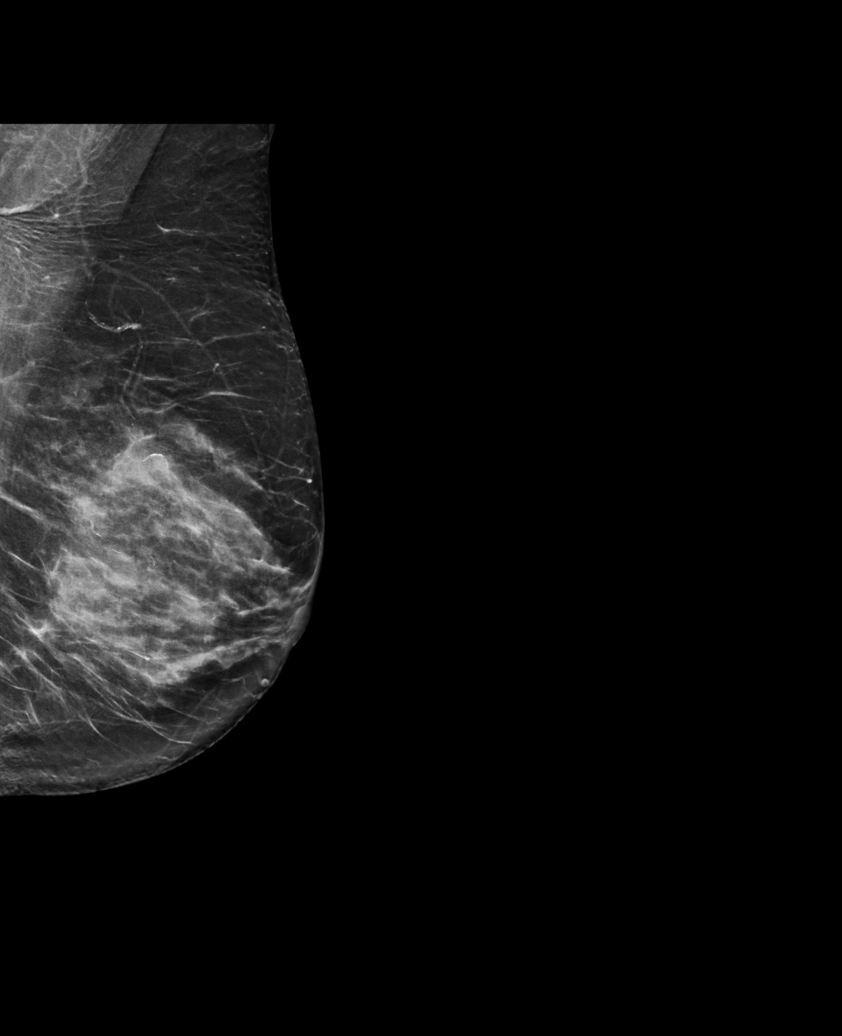

[R MLO synth-2D (2 of 2)]
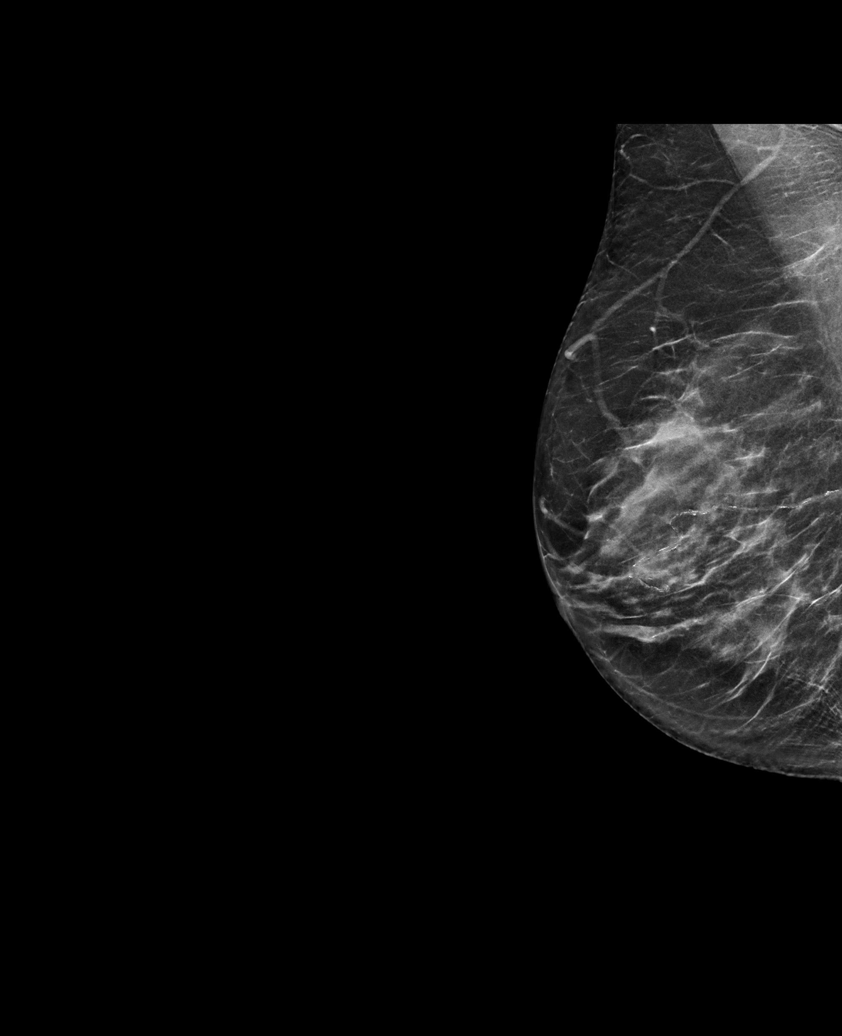

[R CC synth-2D]
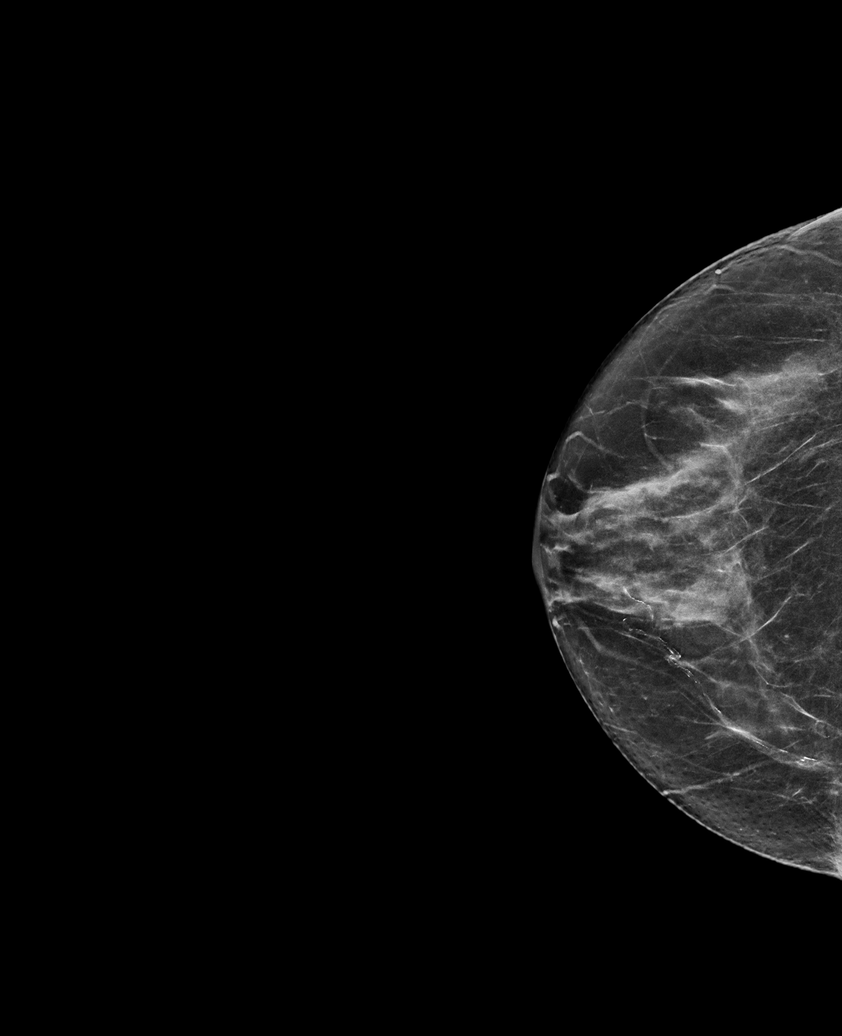

[L CC synth-2D]
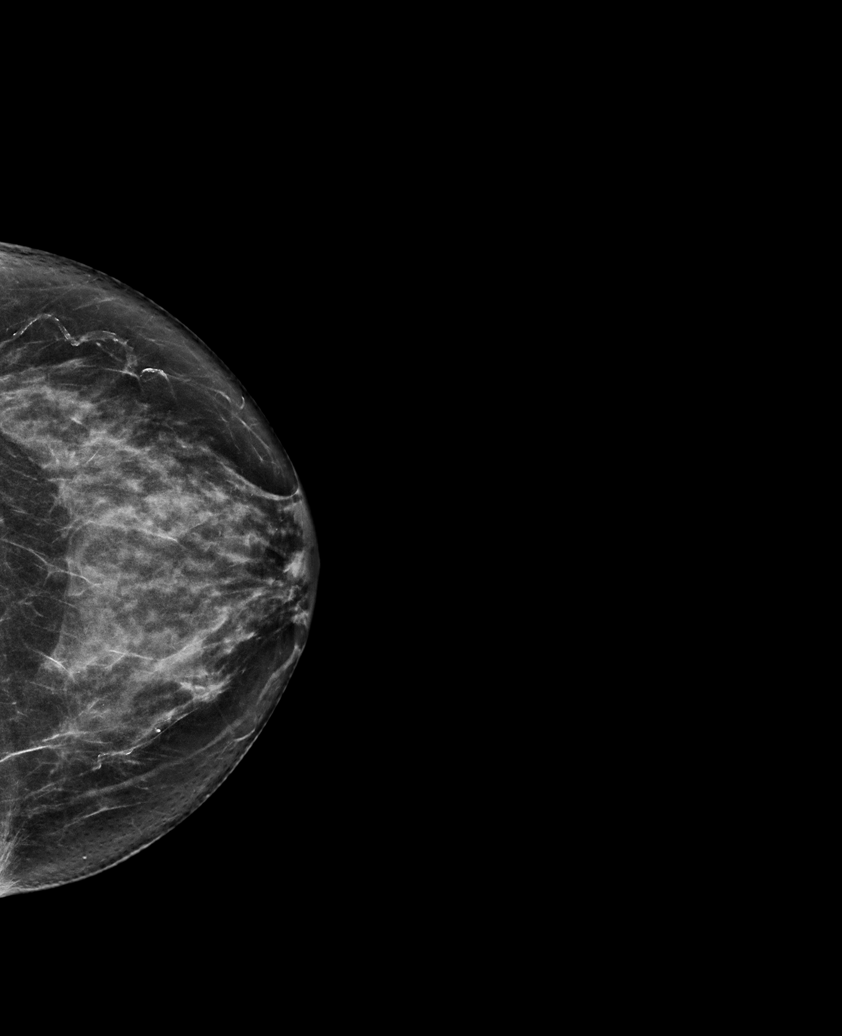

[R MLO tomo · tomo slice 32/63.0]
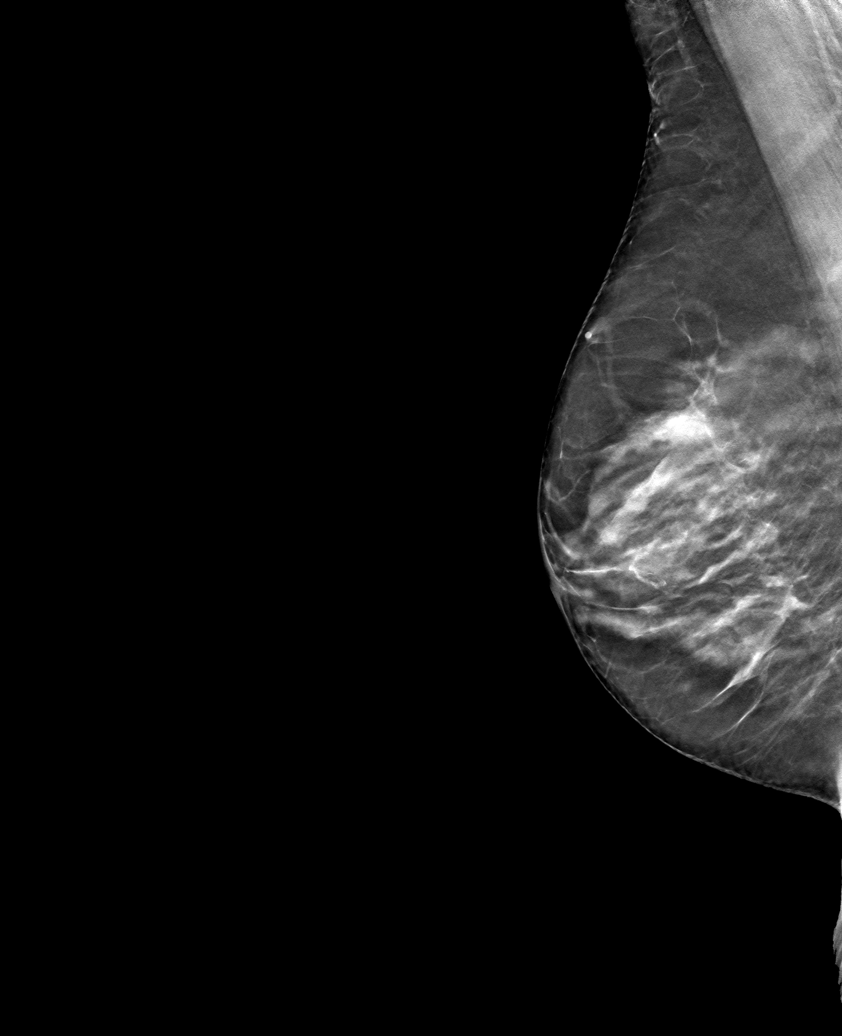

[6 of 30 positions shown; findings below may reference images not displayed]

ACR Breast Density Category c: The breast tissue is heterogeneously
dense, which may obscure small masses.
FINDINGS: There are no findings suspicious for malignancy. Images were
processed with CAD.
IMPRESSION: No mammographic evidence of malignancy. A result letter of this
screening mammogram will be mailed directly to the patient.

RECOMMENDATION:
Screening mammogram in one year. (Code:[id number])

BI-RADS CATEGORY  1: Negative.

## 2020-04-19 ENCOUNTER — Other Ambulatory Visit: Payer: Self-pay | Admitting: Internal Medicine

## 2020-07-18 ENCOUNTER — Other Ambulatory Visit: Payer: Self-pay | Admitting: Internal Medicine

## 2020-08-05 ENCOUNTER — Other Ambulatory Visit: Payer: Self-pay | Admitting: Internal Medicine

## 2020-09-03 ENCOUNTER — Other Ambulatory Visit: Payer: Self-pay | Admitting: Internal Medicine

## 2020-09-03 DIAGNOSIS — Z0000 Encounter for general adult medical examination without abnormal findings: Secondary | ICD-10-CM

## 2020-10-12 ENCOUNTER — Other Ambulatory Visit: Payer: Self-pay

## 2020-10-12 ENCOUNTER — Ambulatory Visit
Admission: RE | Admit: 2020-10-12 | Discharge: 2020-10-12 | Disposition: A | Discharge status: Home / Self Care | Payer: Medicare Other | Source: Ambulatory Visit | Attending: Internal Medicine | Admitting: Internal Medicine

## 2020-10-12 DIAGNOSIS — Z0000 Encounter for general adult medical examination without abnormal findings: Secondary | ICD-10-CM

## 2020-11-15 ENCOUNTER — Encounter: Payer: Self-pay | Admitting: *Deleted

## 2020-11-20 ENCOUNTER — Other Ambulatory Visit (INDEPENDENT_AMBULATORY_CARE_PROVIDER_SITE_OTHER): Payer: Medicare Other

## 2020-11-20 ENCOUNTER — Ambulatory Visit (INDEPENDENT_AMBULATORY_CARE_PROVIDER_SITE_OTHER): Payer: Medicare Other | Admitting: Internal Medicine

## 2020-11-20 ENCOUNTER — Encounter: Payer: Self-pay | Admitting: Internal Medicine

## 2020-11-20 ENCOUNTER — Other Ambulatory Visit: Payer: Medicare Other

## 2020-11-20 VITALS — BP 130/62 | HR 53 | Ht 65.0 in | Wt 164.0 lb

## 2020-11-20 DIAGNOSIS — K219 Gastro-esophageal reflux disease without esophagitis: Secondary | ICD-10-CM

## 2020-11-20 DIAGNOSIS — Z8619 Personal history of other infectious and parasitic diseases: Secondary | ICD-10-CM | POA: Diagnosis not present

## 2020-11-20 DIAGNOSIS — Z86010 Personal history of colonic polyps: Secondary | ICD-10-CM

## 2020-11-20 DIAGNOSIS — R1032 Left lower quadrant pain: Secondary | ICD-10-CM | POA: Diagnosis not present

## 2020-11-20 DIAGNOSIS — R1013 Epigastric pain: Secondary | ICD-10-CM

## 2020-11-20 LAB — COMPREHENSIVE METABOLIC PANEL
ALT: 10 U/L (ref 0–35)
AST: 21 U/L (ref 0–37)
Albumin: 4.2 g/dL (ref 3.5–5.2)
Alkaline Phosphatase: 91 U/L (ref 39–117)
BUN: 20 mg/dL (ref 6–23)
CO2: 25 mEq/L (ref 19–32)
Calcium: 9.1 mg/dL (ref 8.4–10.5)
Chloride: 104 mEq/L (ref 96–112)
Creatinine, Ser: 0.87 mg/dL (ref 0.40–1.20)
GFR: 64.11 mL/min (ref >60.00)
Glucose, Bld: 91 mg/dL (ref 70–99)
Potassium: 4 mEq/L (ref 3.5–5.1)
Sodium: 138 mEq/L (ref 135–145)
Total Bilirubin: 0.5 mg/dL (ref 0.2–1.2)
Total Protein: 7.1 g/dL (ref 6.0–8.3)

## 2020-11-20 LAB — CBC WITH DIFFERENTIAL/PLATELET
Basophils Absolute: 0 10*3/uL (ref 0.0–0.1)
Basophils Relative: 0.4 % (ref 0.0–3.0)
Eosinophils Absolute: 0.2 10*3/uL (ref 0.0–0.7)
Eosinophils Relative: 2.9 % (ref 0.0–5.0)
HCT: 39.6 % (ref 36.0–46.0)
Hemoglobin: 13.4 g/dL (ref 12.0–15.0)
Lymphocytes Relative: 25.5 % (ref 12.0–46.0)
Lymphs Abs: 2.1 10*3/uL (ref 0.7–4.0)
MCHC: 33.8 g/dL (ref 30.0–36.0)
MCV: 90.6 fl (ref 78.0–100.0)
Monocytes Absolute: 0.7 10*3/uL (ref 0.1–1.0)
Monocytes Relative: 7.9 % (ref 3.0–12.0)
Neutro Abs: 5.2 10*3/uL (ref 1.4–7.7)
Neutrophils Relative %: 63.3 % (ref 43.0–77.0)
Platelets: 200 10*3/uL (ref 150.0–400.0)
RBC: 4.38 Mil/uL (ref 3.87–5.11)
RDW: 13.2 % (ref 11.5–15.5)
WBC: 8.3 10*3/uL (ref 4.0–10.5)

## 2020-11-20 NOTE — Patient Instructions (Addendum)
If you are age 77 or older, your body mass index should be between 23-30. Your Body mass index is 27.29 kg/m. If this is out of the aforementioned range listed, please consider follow up with your Primary Care Provider.  If you are age 70 or younger, your body mass index should be between 19-25. Your Body mass index is 27.29 kg/m. If this is out of the aformentioned range listed, please consider follow up with your Primary Care Provider.   Your provider has requested that you go to the basement level for lab work before leaving today. Press "B" on the elevator. The lab is located at the first door on the left as you exit the elevator.  Please continue taking the Protonix 40 MG daily.  Please continue taking your benefiber and miralax combination .  Please reduce taking the bentyl and be careful taking the magnesium supplement as this could contribute to your symptoms.  You have been scheduled for an endoscopy. Please follow written instructions given to you at your visit today. If you use inhalers (even only as needed), please bring them with you on the day of your procedure.    You have been scheduled for a CT scan of the abdomen and pelvis at [facility] [id number], [place], [place] [address])  You are scheduled on 11/22/2020 at 10:00am. You should arrive 30 minutes prior to your appointment time for registration. Please follow the written instructions below on the day of your exam:  WARNING: IF YOU ARE ALLERGIC TO IODINE/X-RAY DYE, PLEASE NOTIFY RADIOLOGY IMMEDIATELY AT [phone]! YOU WILL BE GIVEN A 13 HOUR PREMEDICATION PREP.  1) Do not eat or drink anything after 6:00 am (4 hours prior to your test) 2) You have been given 2 bottles of oral contrast to drink. The solution may taste better if refrigerated, but do NOT add ice or any other liquid to this solution. Shake well before drinking.    Drink 1 bottle of contrast @ 8:00 am (2 hours prior to your  exam)  Drink 1 bottle of contrast @ 9:00 am (1 hour prior to your exam)  You may take any medications as prescribed with a small amount of water, if necessary.  The purpose of you drinking the oral contrast is to aid in the visualization of your intestinal tract. The contrast solution may cause some diarrhea. Depending on your individual set of symptoms, you may also receive an intravenous injection of x-ray contrast/dye. Plan on being at [facility] for 30 minutes or longer, depending on the type of exam you are having performed.  This test typically takes 30-45 minutes to complete.  If you have any questions regarding your exam or if you need to reschedule, you may call the CT department at [phone] between the hours of 8:00 am and 5:00 pm, Monday-Friday.  ________________________________________________________________________

## 2020-11-20 NOTE — Progress Notes (Signed)
Subjective:    Patient ID: Cathy [name], female    DOB: [birth year], 79 y.o.   MRN: [id number]  HPI Cathy [name] is a 79 year old female with a history of H. pylori gastritis, borderline gallbladder function, adenomatous colon polyp, diverticulosis with diverticulitis, hyperlipidemia who is here for follow-up to evaluate epigastric abdominal pain and left lower quadrant pain.  She is here alone today.  She is known to me from office visit in September 2019 and upper endoscopy performed in November 2019.  At the time of her last office visit we hypothesized that she possibly had recurrent H. pylori and we perform an upper endoscopy in November 2019.  This EGD showed gastric erythema which biopsy positive for H. pylori.  Exam was otherwise normal.  She did have a 1 cm hiatal hernia.  She was treated with Pylera and had documentation that H. pylori was successfully treated by stool antigen in December 2019.  She did well for some time but has developed recurrent epigastric burning pain.  This is present despite using pantoprazole 40 mg daily.  Protonix definitely helps the symptom but does not relieve it.  She is not having heartburn or pyrosis symptom.  She can have some mild nausea if she gets hungry but no vomiting.  Appetite has been good.  Sometimes eating relieves the burning pain transiently.  She has not had many episodes of right upper quadrant pain which she had previously radiating to her right scapula.  This epigastric burning pain reminds her of her previous H. pylori infections.  About 2-1/2 months ago she had significant left lower quadrant abdominal pain which she states was "really bad".  This was cramping in nature.  She went to urgent care and was diagnosed with diverticulitis and treated with antibiotics.  Pain improved but did not resolve and so she saw Dr. Melvyn Neth, her PCP and receive additional antibiotic therapy.  Pain overall better but still present and nagging.  Bowel  movements are mostly formed but can be loose while other days she feels constipated.  She has not seen blood in her stool or melena.  She has been using magnesium about every other day as it helps with hot flashes, she uses Benefiber every night and MiraLAX about every other day.  No fevers or chills.  She is using Bentyl but it does not help usually 1 or 2 times per day.  She is concerned it raises her blood pressure.  Her last colonoscopy was in 2019, March which showed a transverse 5 mm polyp found to be adenomatous.  Scattered diverticulosis and internal hemorrhoids.  Review of Systems As per HPI, otherwise negative  Current Medications, Allergies, Past Medical History, Past Surgical History, Family History and Social History were reviewed in [employer] record.     Objective:   Physical Exam BP 130/62   Pulse (!) 53   Ht 5\' 5"  (1.651 m)   Wt 164 lb (74.4 kg)   BMI 27.29 kg/m  Gen: awake, alert, NAD HEENT: anicteric CV: RRR, no mrg Pulm: CTA b/l Abd: soft, epigastric tenderness which is mild and there is moderate left lower quadrant tenderness with mild guarding but no rebound, nondistended, +BS throughout Ext: no c/c/e Neuro: nonfocal     Assessment & Plan:  79 year old female with a history of H. pylori gastritis, borderline gallbladder function, adenomatous colon polyp, diverticulosis with diverticulitis, hyperlipidemia who is here for follow-up to evaluate epigastric abdominal pain and left lower quadrant pain.  1.  Left lower quadrant pain --abdomen is quite tender and she was treated for diverticulitis several months ago.  Very stuttering diverticulitis versus segmental colitis versus irritable bowel type pain.  I recommended that we evaluate with cross-sectional imaging --CBC and CMP --CT scan of the abdomen pelvis with contrast --Bentyl has not been very helpful but she can still try it at 10 to 20 mg 3 times a day as needed --GI pathogen  panel  2. Epigastric pain --history of H. pylori treated twice the last time by me with Pylera after upper endoscopy. She did have an H. pylori stool antigen negative after treatment off PPI but I cannot ensure this has not recurred without further testing. She is hesitant to hold PPI given symptoms which began when she does so --Repeat upper endoscopy in the [facility], H. pylori biopsies; after #1 studies are complete --Continue pantoprazole 40 mg daily  3. History of constipation --I cautioned her that magnesium can loosen stool, for now she will continue with MiraLAX and Benefiber combination   4. History of adenomatous colon polyp --surveillance colonoscopy would be 2024 based on overall health at that time

## 2020-11-22 ENCOUNTER — Ambulatory Visit (HOSPITAL_BASED_OUTPATIENT_CLINIC_OR_DEPARTMENT_OTHER)
Admission: RE | Admit: 2020-11-22 | Discharge: 2020-11-22 | Disposition: A | Discharge status: Home / Self Care | Payer: Medicare Other | Source: Ambulatory Visit | Attending: Internal Medicine | Admitting: Internal Medicine

## 2020-11-22 ENCOUNTER — Encounter (HOSPITAL_BASED_OUTPATIENT_CLINIC_OR_DEPARTMENT_OTHER): Payer: Self-pay

## 2020-11-22 ENCOUNTER — Other Ambulatory Visit: Payer: Medicare Other

## 2020-11-22 ENCOUNTER — Other Ambulatory Visit: Payer: Self-pay

## 2020-11-22 DIAGNOSIS — R1032 Left lower quadrant pain: Secondary | ICD-10-CM | POA: Diagnosis not present

## 2020-11-22 DIAGNOSIS — R1013 Epigastric pain: Secondary | ICD-10-CM

## 2020-11-22 DIAGNOSIS — Z8619 Personal history of other infectious and parasitic diseases: Secondary | ICD-10-CM

## 2020-11-22 MED ORDER — IOHEXOL 300 MG/ML  SOLN
100.0000 mL | Freq: Once | INTRAMUSCULAR | Status: AC | PRN
  Administered 2020-11-22: 10:00:00 100 mL via INTRAVENOUS

## 2020-11-26 LAB — GI PROFILE, STOOL, PCR

## 2020-12-05 ENCOUNTER — Other Ambulatory Visit: Payer: Self-pay | Admitting: Internal Medicine

## 2020-12-05 ENCOUNTER — Ambulatory Visit (AMBULATORY_SURGERY_CENTER): Payer: Medicare Other | Admitting: Internal Medicine

## 2020-12-05 ENCOUNTER — Encounter: Payer: Self-pay | Admitting: Internal Medicine

## 2020-12-05 ENCOUNTER — Other Ambulatory Visit: Payer: Self-pay

## 2020-12-05 VITALS — BP 131/57 | HR 60 | Temp 98.4°F | Resp 12 | Ht 65.0 in | Wt 164.0 lb

## 2020-12-05 DIAGNOSIS — B9681 Helicobacter pylori [H. pylori] as the cause of diseases classified elsewhere: Secondary | ICD-10-CM

## 2020-12-05 DIAGNOSIS — K2951 Unspecified chronic gastritis with bleeding: Secondary | ICD-10-CM

## 2020-12-05 DIAGNOSIS — K449 Diaphragmatic hernia without obstruction or gangrene: Secondary | ICD-10-CM

## 2020-12-05 DIAGNOSIS — K2970 Gastritis, unspecified, without bleeding: Secondary | ICD-10-CM

## 2020-12-05 DIAGNOSIS — R1012 Left upper quadrant pain: Secondary | ICD-10-CM

## 2020-12-05 DIAGNOSIS — Z8619 Personal history of other infectious and parasitic diseases: Secondary | ICD-10-CM

## 2020-12-05 MED ORDER — SODIUM CHLORIDE 0.9 % IV SOLN: 500.0000 mL | Freq: Once | INTRAVENOUS | Status: DC

## 2020-12-05 NOTE — Op Note (Signed)
[facility] Patient Name: Cathy [name] Procedure Date: 12/05/2020 2:55 PM MRN: [id number] Endoscopist: Beverley Fiedler , MD Age: 79 Referring MD:  Date of Birth: 09/14/1941 Gender: Female Account #: [id number] Procedure:                Upper GI endoscopy Indications:              Epigastric and left upper abdominal pain, personal                            history of H. Pylori gastritis s/p treatment with                            Pylera after EGD in Nov 2019; recent symptoms                            similar in character, recent CT abd/pelvis                            unremarkable Medicines:                Monitored Anesthesia Care Procedure:                Pre-Anesthesia Assessment:                           - Prior to the procedure, a History and Physical                            was performed, and patient medications and                            allergies were reviewed. The patient's tolerance of                            previous anesthesia was also reviewed. The risks                            and benefits of the procedure and the sedation                            options and risks were discussed with the patient.                            All questions were answered, and informed consent                            was obtained. Prior Anticoagulants: The patient has                            taken no previous anticoagulant or antiplatelet                            agents. ASA Grade Assessment: II - A patient with  mild systemic disease. After reviewing the risks                            and benefits, the patient was deemed in                            satisfactory condition to undergo the procedure.                           After obtaining informed consent, the endoscope was                            passed under direct vision. Throughout the                            procedure, the patient's blood pressure, pulse, and                             oxygen saturations were monitored continuously. The                            Endoscope was introduced through the mouth, and                            advanced to the second part of duodenum. The upper                            GI endoscopy was accomplished without difficulty.                            The patient tolerated the procedure well. Scope In: Scope Out: Findings:                 The examined esophagus was normal.                           A 2 cm hiatal hernia was present.                           Mildly erythematous mucosa was found in the gastric                            fundus. Biopsies were taken with a cold forceps for                            histology and Helicobacter pylori testing (jar 2).                           The gastric body, incisura and gastric antrum were                            normal. Biopsies were taken with a cold forceps for  histology and Helicobacter pylori testing (jar 1).                           The examined duodenum was normal. Complications:            No immediate complications. Estimated Blood Loss:     Estimated blood loss: none. Impression:               - Normal esophagus.                           - 2 cm hiatal hernia.                           - Erythematous mucosa in the gastric fundus.                            Biopsied to evaluate for H. Pylori infection.                           - Normal gastric body, incisura and antrum.                            Biopsied to evaluate for H. Pylori infection.                           - Normal examined duodenum. Recommendation:           - Patient has a contact number available for                            emergencies. The signs and symptoms of potential                            delayed complications were discussed with the                            patient. Return to normal activities tomorrow.                            Written  discharge instructions were provided to the                            patient.                           - Resume previous diet.                           - Continue present medications.                           - Await pathology results. Beverley Fiedler, MD 12/05/2020 3:24:29 PM This report has been signed electronically.

## 2020-12-05 NOTE — Patient Instructions (Addendum)
Handouts were given to you on gastritis and a hiatal hernia. You may resume your current medications today. Await biopsy results.  May take 2-3 weeks to receive your pathology results. Please call if any questions or concerns.     YOU HAD AN ENDOSCOPIC PROCEDURE TODAY AT THE [facility]:   Refer to the procedure report that was given to you for any specific questions about what was found during the examination.  If the procedure report does not answer your questions, please call your gastroenterologist to clarify.  If you requested that your care partner not be given the details of your procedure findings, then the procedure report has been included in a sealed envelope for you to review at your convenience later.  YOU SHOULD EXPECT: Some feelings of bloating in the abdomen. Passage of more gas than usual.  Walking can help get rid of the air that was put into your GI tract during the procedure and reduce the bloating. If you had a lower endoscopy (such as a colonoscopy or flexible sigmoidoscopy) you may notice spotting of blood in your stool or on the toilet paper. If you underwent a bowel prep for your procedure, you may not have a normal bowel movement for a few days.  Please Note:  You might notice some irritation and congestion in your nose or some drainage.  This is from the oxygen used during your procedure.  There is no need for concern and it should clear up in a day or so.  SYMPTOMS TO REPORT IMMEDIATELY:    Following upper endoscopy (EGD)  Vomiting of blood or coffee ground material  New chest pain or pain under the shoulder blades  Painful or persistently difficult swallowing  New shortness of breath  Fever of 100F or higher  Black, tarry-looking stools  For urgent or emergent issues, a gastroenterologist can be reached at any hour by calling [phone]. Do not use MyChart messaging for urgent concerns.    DIET:  We do recommend a small meal at first, but  then you may proceed to your regular diet.  Drink plenty of fluids but you should avoid alcoholic beverages for 24 hours.  ACTIVITY:  You should plan to take it easy for the rest of today and you should NOT DRIVE or use heavy machinery until tomorrow (because of the sedation medicines used during the test).    FOLLOW UP: Our staff will call the number listed on your records 48-72 hours following your procedure to check on you and address any questions or concerns that you may have regarding the information given to you following your procedure. If we do not reach you, we will leave a message.  We will attempt to reach you two times.  During this call, we will ask if you have developed any symptoms of COVID 19. If you develop any symptoms (ie: fever, flu-like symptoms, shortness of breath, cough etc.) before then, please call [phone].  If you test positive for Covid 19 in the 2 weeks post procedure, please call and report this information to [place].    If any biopsies were taken you will be contacted by phone or by letter within the next 1-3 weeks.  Please call us at [phone] if you have not heard about the biopsies in 3 weeks.    SIGNATURES/CONFIDENTIALITY: You and/or your care partner have signed paperwork which will be entered into your electronic medical record.  These signatures attest to the fact that that the  information above on your After Visit Summary has been reviewed and is understood.  Full responsibility of the confidentiality of this discharge information lies with you and/or your care-partner.

## 2020-12-05 NOTE — Progress Notes (Signed)
History reviewed today  VS AG 

## 2020-12-05 NOTE — Progress Notes (Signed)
Called to room to assist during endoscopic procedure.  Patient ID and intended procedure confirmed with present staff. Received instructions for my participation in the procedure from the performing physician.  

## 2020-12-05 NOTE — Progress Notes (Signed)
No problems noted in the recovery room. maw 

## 2020-12-05 NOTE — Progress Notes (Signed)
Report given to PACU, vss 

## 2020-12-10 ENCOUNTER — Telehealth: Payer: Self-pay

## 2020-12-10 NOTE — Telephone Encounter (Signed)
  Follow up Call-  Call back number 12/05/2020 10/11/2018  Post procedure Call Back phone  # [phone]  Permission to leave phone message Yes Yes  Some recent data might be hidden     Patient questions:  Do you have a fever, pain , or abdominal swelling? Yes.   Pain Score  3 *  Have you tolerated food without any problems? Yes.    Have you been able to return to your normal activities? Yes.    Do you have any questions about your discharge instructions: Diet   No. Medications  No. Follow up visit  No.  Do you have questions or concerns about your Care? No.  Actions: * If pain score is 4 or above: No action needed, pain <4.  Pt reported she has a sore throat.  Rated pain 3/10.  She has been gargling with warm, salt water for 3 days.  I advised her to try cepacol or chloroseptic loz.  Hopefully, this will coat and sooth her sore throat.  She said she has been on a regular diet and swallow fine.  I advised pt to call us back if the loz do not help her sx.  Pt said she would. Maw    1. Have you developed a fever since your procedure? no  2.   Have you had an respiratory symptoms (SOB or cough) since your procedure? no  3.   Have you tested positive for COVID 19 since your procedure no  4.   Have you had any family members/close contacts diagnosed with the COVID 19 since your procedure?  no   If yes to any of these questions please route to Laverna Peace, RN and Karlton Lemon, RN

## 2020-12-13 ENCOUNTER — Other Ambulatory Visit: Payer: Self-pay

## 2020-12-13 NOTE — Telephone Encounter (Signed)
Dr Rhea Belton I am getting a pharmacy warning with Cathy [name].  Please advise   PCN- rash VERY HIGH Cephalosporins- hives LOW

## 2020-12-14 ENCOUNTER — Other Ambulatory Visit: Payer: Self-pay

## 2020-12-14 DIAGNOSIS — Z8619 Personal history of other infectious and parasitic diseases: Secondary | ICD-10-CM

## 2020-12-14 MED ORDER — TALICIA 250-12.5-10 MG PO CPDR: 4.0000 | DELAYED_RELEASE_CAPSULE | Freq: Three times a day (TID) | ORAL | 0 refills | Status: AC

## 2020-12-14 NOTE — Telephone Encounter (Signed)
Cathy [name]  Can you please check with the patient to see if she has taken PCN containing medications (it says "low" in the allergy profile for her) This would include penicillin, amoxicillin THanks

## 2020-12-14 NOTE — Telephone Encounter (Signed)
Dr Rhea Belton the pt states it has been 50 years since she had a reaction to PCN. She says she has taken it in the past and had no reaction.  I have sent the prescription to the pharmacy.

## 2020-12-18 ENCOUNTER — Telehealth: Payer: Self-pay

## 2020-12-18 NOTE — Telephone Encounter (Signed)
Cathy [name] approved but still $620.  Received a fax that it was going to be shipped to the patient.  This is cost prohibitive for the patient so I cancelled that order with [facility] and am planning to provide her with a sample.  Spoke with patient to let her know I would call her when the sample was here to pick up.  Patient agreed.

## 2020-12-21 NOTE — Telephone Encounter (Signed)
Pt Called inquiring about samples. She stated that she has to pick up her husband at [facility] hospital at 2:00pm today so is wondering if she could pick up samples today. Pls call her.

## 2020-12-21 NOTE — Telephone Encounter (Signed)
I have spoken to patient to advise that we have sample of full course Talicia at the front desk awaiting her pick up.  She should take 4 capsules by mouth every 8 hours x 14 days. Patient to come today for pick up.  Lot [id number] Exp 2022-APR -30 2 bottles x 84 capsules

## 2020-12-21 NOTE — Telephone Encounter (Signed)
Sample signed for and given to Dr. Rhea Belton to give to patient

## 2021-01-02 ENCOUNTER — Encounter: Payer: Medicare Other | Admitting: Internal Medicine

## 2021-02-13 ENCOUNTER — Other Ambulatory Visit: Payer: Medicare Other

## 2021-02-13 ENCOUNTER — Other Ambulatory Visit: Payer: Self-pay

## 2021-02-13 ENCOUNTER — Telehealth: Payer: Self-pay | Admitting: Internal Medicine

## 2021-02-13 NOTE — Telephone Encounter (Signed)
Pt knows she can pick up the kit from the lab, order in epic.

## 2021-02-13 NOTE — Telephone Encounter (Signed)
Left message for pt to call back  °

## 2021-02-13 NOTE — Telephone Encounter (Signed)
Pt is requesting a call back from a nurse to discuss obtaining a stool sample kit

## 2021-02-22 ENCOUNTER — Ambulatory Visit: Payer: Medicare Other | Admitting: Cardiology

## 2021-02-28 ENCOUNTER — Ambulatory Visit: Payer: Medicare Other | Admitting: Cardiology

## 2021-03-14 ENCOUNTER — Other Ambulatory Visit: Payer: Medicare Other

## 2021-03-14 DIAGNOSIS — Z8619 Personal history of other infectious and parasitic diseases: Secondary | ICD-10-CM

## 2021-03-15 LAB — HELICOBACTER PYLORI  SPECIAL ANTIGEN
MICRO NUMBER:: 11743504
SPECIMEN QUALITY: ADEQUATE

## 2021-03-25 ENCOUNTER — Ambulatory Visit (INDEPENDENT_AMBULATORY_CARE_PROVIDER_SITE_OTHER): Payer: Medicare Other | Admitting: Cardiology

## 2021-03-25 ENCOUNTER — Other Ambulatory Visit: Payer: Self-pay

## 2021-03-25 ENCOUNTER — Encounter: Payer: Self-pay | Admitting: Cardiology

## 2021-03-25 ENCOUNTER — Encounter: Payer: Self-pay | Admitting: *Deleted

## 2021-03-25 VITALS — BP 128/64 | HR 64 | Ht 65.5 in | Wt 169.0 lb

## 2021-03-25 DIAGNOSIS — R0602 Shortness of breath: Secondary | ICD-10-CM | POA: Diagnosis not present

## 2021-03-25 DIAGNOSIS — E782 Mixed hyperlipidemia: Secondary | ICD-10-CM

## 2021-03-25 DIAGNOSIS — I471 Supraventricular tachycardia: Secondary | ICD-10-CM | POA: Diagnosis not present

## 2021-03-25 DIAGNOSIS — R0609 Other forms of dyspnea: Secondary | ICD-10-CM

## 2021-03-25 DIAGNOSIS — R0600 Dyspnea, unspecified: Secondary | ICD-10-CM

## 2021-03-25 NOTE — Progress Notes (Signed)
Cardiology Office Note  Date: 03/25/2021   ID: Cathy [name], Cathy [name] [birth year], MRN [id number]  PCP:  Theodoro Kos, MD  Cardiologist:  Nona Dell, MD Electrophysiologist:  None   Chief Complaint  Patient presents with  . Shortness of Breath    History of Present Illness: Cathy [name] is a 80 y.o. female referred for cardiology consultation by Dr. Wynona Neat for evaluation of shortness of breath.  She tells me that over the last year or so she has been noticing more significant dyspnea on exertion than she typically experienced.  She has to stop and rest sometimes after going up a flight of steps.  No exertional chest pain or sense of palpitations, no syncope.  She does report history of asthma, but states that this has been well controlled, no wheezing or cough..  Records indicate previous evaluation with [facility].  She did have an echocardiogram back in 2014 which demonstrated normal LVEF at 55 to 60% with grade 2 diastolic dysfunction, mild mitral regurgitation, and mildly elevated PASP.  She also reports a prior history of "tachycardia" possibly PSVT or atrial tachycardia based on limited records.  She has not had any significant palpitations and is on Coreg most recently, prior to that a different form of beta-blocker.  I reviewed the remainder of her medications, outlined below.  She did undergo great toe surgery on the left within the last few months, currently in a boot on that side.  No change in symptomatology based on timing of this surgery.  Past Medical History:  Diagnosis Date  . Allergic rhinitis   . Arthritis   . Asthma   . Cataract 01/2018  . GERD (gastroesophageal reflux disease)   . H. pylori infection   . Hiatal hernia   . History of adenomatous polyp of colon   . History of blood transfusion   . Hyperlipidemia   . Internal hemorrhoids   . Osteoporosis   . Paroxysmal supraventricular tachycardia (HCC)   . Wound infection after  surgery 03/2016    Past Surgical History:  Procedure Laterality Date  . ANKLE FRACTURE SURGERY Left 12/2015  . APPENDECTOMY    . BILATERAL SALPINGOOPHORECTOMY    . COLONOSCOPY  02/2108   polyps removed  . I & D EXTREMITY N/A 03/12/2016   Procedure: IRRIGATION AND DEBRIDEMENT EXTREMITY;  Surgeon: Toni Arthurs, MD;  Location: [facility] OR;  Service: Orthopedics;  Laterality: N/A;  . squamous cell cancer face    . TOE SURGERY    . TONSILLECTOMY    . TOTAL ABDOMINAL HYSTERECTOMY    . vocal cord nodules      Current Outpatient Medications  Medication Sig Dispense Refill  . atorvastatin (LIPITOR) 10 MG tablet Take 1 tablet by mouth at bedtime.    Marland Kitchen azelastine (ASTELIN) 0.1 % nasal spray Place 1 spray into both nostrils at bedtime.  11  . carvedilol (COREG) 6.25 MG tablet Take 2 tablets by mouth daily.    . fluticasone (FLONASE) 50 MCG/ACT nasal spray Place 2 sprays into the nose every evening.    . fluticasone (FLOVENT HFA) 110 MCG/ACT inhaler Inhale 2 puffs into the lungs 2 (two) times daily. 3 Inhaler 3  . montelukast (SINGULAIR) 10 MG tablet Take 10 mg by mouth at bedtime.    . Multiple Vitamins-Minerals (PRESERVISION AREDS 2) CAPS Take 2 capsules by mouth daily.    . pantoprazole (PROTONIX) 40 MG tablet TAKE 1 TABLET(40 MG) BY MOUTH DAILY. MUST HAVE OFFICE VISIT FOR  FURTHER REFILLS 90 tablet 0  . Probiotic Product (PROBIOTIC-10) CAPS Take 1 capsule by mouth daily.    . Vitamin D, Ergocalciferol, (DRISDOL) 50000 units CAPS capsule Take 1 capsule by mouth once a week.    Marland Kitchen albuterol (PROVENTIL HFA) 108 (90 Base) MCG/ACT inhaler Inhale 2 puffs into the lungs every 6 (six) hours as needed for wheezing or shortness of breath. 1 Inhaler prn   No current facility-administered medications for this visit.   Allergies:  Keflex [cephalexin], Penicillins, and Sulfonamide derivatives   Social History: The patient  reports that she has never smoked. She has never used smokeless tobacco. She reports that she  does not drink alcohol and does not use drugs.   Family History: The patient's family history includes CVA in her father and mother; Heart disease in her father.   ROS: No orthopnea or PND.  Physical Exam: VS:  BP 128/64 (BP Location: Right Arm)   Pulse 64   Ht 5' 5.5" (1.664 m)   Wt 169 lb (76.7 kg)   SpO2 98%   BMI 27.70 kg/m , BMI Body mass index is 27.7 kg/m.  Wt Readings from Last 3 Encounters:  03/25/21 169 lb (76.7 kg)  12/05/20 164 lb (74.4 kg)  11/20/20 164 lb (74.4 kg)    General: Patient appears comfortable at rest. HEENT: Conjunctiva and lids normal, wearing a mask. Neck: Supple, no elevated JVP or carotid bruits, no thyromegaly. Lungs: Clear to auscultation, nonlabored breathing at rest. Cardiac: Regular rate and rhythm, no S3 or significant systolic murmur, no pericardial rub. Abdomen: Soft, bowel sounds present. Extremities: Support boot on left lower leg and foot.  Distal pulses 2+. Skin: Warm and dry. Musculoskeletal: No kyphosis. Neuropsychiatric: Alert and oriented x3, affect grossly appropriate.  ECG:  No prior tracing available for review today.  Recent Labwork: June 2021: Cholesterol 175, TG 81, HDL 59, LDL 100, TSH 3.26 54/27/0623: ALT 10; AST 21; BUN 20; Creatinine, Ser 0.87; Hemoglobin 13.4; Platelets 200.0; Potassium 4.0; Sodium 138  March 2022: Hemoglobin 11.9, platelets 201  Other Studies Reviewed Today:  Echocardiogram 08/04/2013 [facility]): Summary 1. Overall left ventricular ejection fraction is estimated at 55 to 60%. 2. Normal global left ventricular systolic function. 3. Pseudonormal pattern of LV diastolic filling. 4. Mild mitral valve regurgitation. 5. Mildly elevated pulmonary artery systolic pressure. 6. Elevated left ventricular end diastolic pressure by tissue Doppler. 7. No intracardiac thrombi, mass or vegetations.  Assessment and Plan:  1.  Progressive sense of dyspnea on exertion as discussed above in a 80 year old  woman with a history of hyperlipidemia, remotely documented PSVT with no recurrent palpitations on beta-blocker, and asthma which she states has been very well controlled.  Plan is to obtain an echocardiogram to assess cardiac structure and function, also a Lexiscan Myoview for assessment of ischemia (not able to manage treadmill due to left foot surgery and protective boot in place).  2.  Mixed hyperlipidemia, on Lipitor.  LDL 100 by check in June 2021.  Medication Adjustments/Labs and Tests Ordered: Current medicines are reviewed at length with the patient today.  Concerns regarding medicines are outlined above.   Tests Ordered: Orders Placed This Encounter  Procedures  . NM Myocar Multi W/Spect W/Wall Motion / EF  . ECHOCARDIOGRAM COMPLETE    Medication Changes: No orders of the defined types were placed in this encounter.   Disposition:  Follow up test results.  Signed, Jonelle Sidle, MD, [facility] 03/25/2021 1:41 PM    [facility] [place], [place], [place] [address] Phone: [phone]; Fax: [phone]

## 2021-03-25 NOTE — Patient Instructions (Signed)
Medication Instructions:  Continue all current medications.  Labwork: none  Testing/Procedures:  Your physician has requested that you have an echocardiogram. Echocardiography is a painless test that uses sound waves to create images of your heart. It provides your doctor with information about the size and shape of your heart and how well your heart's chambers and valves are working. This procedure takes approximately one hour. There are no restrictions for this procedure.  Your physician has requested that you have a lexiscan myoview. For further information please visit [email or web address]. Please follow instruction sheet, as given.  Office will contact with results via phone or letter.    Follow-Up: Pending test results   Any Other Special Instructions Will Be Listed Below (If Applicable).  If you need a refill on your cardiac medications before your next appointment, please call your pharmacy.  

## 2021-03-29 ENCOUNTER — Other Ambulatory Visit: Payer: Self-pay

## 2021-03-29 ENCOUNTER — Ambulatory Visit (HOSPITAL_COMMUNITY)
Admission: RE | Admit: 2021-03-29 | Discharge: 2021-03-29 | Disposition: A | Discharge status: Home / Self Care | Payer: Medicare Other | Source: Ambulatory Visit | Attending: Cardiology | Admitting: Cardiology

## 2021-03-29 ENCOUNTER — Encounter (HOSPITAL_COMMUNITY): Payer: Self-pay

## 2021-03-29 ENCOUNTER — Encounter (HOSPITAL_BASED_OUTPATIENT_CLINIC_OR_DEPARTMENT_OTHER)
Admission: RE | Admit: 2021-03-29 | Discharge: 2021-03-29 | Disposition: A | Discharge status: Home / Self Care | Payer: Medicare Other | Source: Ambulatory Visit | Attending: Cardiology | Admitting: Cardiology

## 2021-03-29 DIAGNOSIS — R0609 Other forms of dyspnea: Secondary | ICD-10-CM

## 2021-03-29 DIAGNOSIS — R0600 Dyspnea, unspecified: Secondary | ICD-10-CM | POA: Insufficient documentation

## 2021-03-29 LAB — NM MYOCAR MULTI W/SPECT W/WALL MOTION / EF
LV dias vol: 63 mL (ref 46–106)
LV sys vol: 19 mL
Peak HR: 93 {beats}/min
RATE: 0.39
Rest HR: 53 {beats}/min
SDS: 2
SRS: 1
SSS: 3
TID: 1.28

## 2021-03-29 LAB — ECHOCARDIOGRAM COMPLETE
Area-P 1/2: 2.71 cm2
S' Lateral: 2.22 cm

## 2021-03-29 MED ORDER — SODIUM CHLORIDE FLUSH 0.9 % IV SOLN
INTRAVENOUS | Status: AC
  Administered 2021-03-29: 10 mL via INTRAVENOUS
  Filled 2021-03-29: qty 10

## 2021-03-29 MED ORDER — TECHNETIUM TC 99M TETROFOSMIN IV KIT
30.0000 | PACK | Freq: Once | INTRAVENOUS | Status: AC | PRN
  Administered 2021-03-29: 31.3 via INTRAVENOUS

## 2021-03-29 MED ORDER — REGADENOSON 0.4 MG/5ML IV SOLN
INTRAVENOUS | Status: AC
  Administered 2021-03-29: 0.4 mg via INTRAVENOUS
  Filled 2021-03-29: qty 5

## 2021-03-29 MED ORDER — TECHNETIUM TC 99M TETROFOSMIN IV KIT
10.0000 | PACK | Freq: Once | INTRAVENOUS | Status: AC | PRN
  Administered 2021-03-29: 10.3 via INTRAVENOUS

## 2021-03-29 NOTE — Progress Notes (Signed)
*  PRELIMINARY RESULTS* Echocardiogram 2D Echocardiogram has been performed.  Jeryl Columbia 03/29/2021, 12:52 PM

## 2021-04-01 ENCOUNTER — Telehealth: Payer: Self-pay | Admitting: *Deleted

## 2021-04-01 NOTE — Telephone Encounter (Signed)
Patient informed and verbalized understanding of plan. Copy sent to PCP 

## 2021-04-01 NOTE — Telephone Encounter (Signed)
-----   Message from Jonelle Sidle, MD sent at 03/29/2021  4:42 PM EDT ----- Results reviewed.  LVEF is normal at 60 to 65% with only mild diastolic dysfunction which would not be unexpected, normal estimated pulmonary artery systolic pressure, and only mild mitral regurgitation.  Small anterior pericardial effusion is likely asymptomatic.  No clearly acute findings to explain her symptoms.

## 2021-04-01 NOTE — Telephone Encounter (Signed)
-----   Message from Jonelle Sidle, MD sent at 03/29/2021  4:38 PM EDT ----- Results reviewed.  Please let her know that the stress test did not show any definite ischemic territories to suggest obstructive CAD as cause of her symptoms.  Unless symptoms worsen, would hold off on further cardiac testing for now.  We can certainly see her back if the situation changes, at which point consideration for a follow-up cardiac catheterization could be reviewed.

## 2021-04-04 ENCOUNTER — Encounter: Payer: Self-pay | Admitting: Gastroenterology

## 2021-04-04 ENCOUNTER — Ambulatory Visit (INDEPENDENT_AMBULATORY_CARE_PROVIDER_SITE_OTHER): Payer: Medicare Other | Admitting: Gastroenterology

## 2021-04-04 VITALS — BP 124/64 | HR 64 | Ht 65.5 in | Wt 164.0 lb

## 2021-04-04 DIAGNOSIS — R1084 Generalized abdominal pain: Secondary | ICD-10-CM

## 2021-04-04 MED ORDER — METRONIDAZOLE 500 MG PO TABS: 500.0000 mg | ORAL_TABLET | Freq: Three times a day (TID) | ORAL | 0 refills | Status: DC

## 2021-04-04 NOTE — Patient Instructions (Addendum)
If you are age 78 or older, your body mass index should be between 23-30. Your Body mass index is 26.88 kg/m. If this is out of the aforementioned range listed, please consider follow up with your Primary Care Provider.  If you are age 37 or younger, your body mass index should be between 19-25. Your Body mass index is 26.88 kg/m. If this is out of the aformentioned range listed, please consider follow up with your Primary Care Provider.   We have sent the following medications to your pharmacy for you to pick up at your convenience: Flagyl 500 mg three times daily for 10 days.  Call us in 10 days with and update ask Patty Doroteo Glassman or Selinda Michaels.   Thank you for choosing me and [facility].  Doug Sou, PA-C

## 2021-04-04 NOTE — Progress Notes (Signed)
04/04/2021 Taleeyah Bora [id number] 08/21/1941   HISTORY OF PRESENT ILLNESS: This is a pleasant 80 year old female who is a patient of Dr. Lauro Franklin.  She has history of H. pylori gastritis, borderline gallbladder function, adenomatous colon polyps, diverticulosis with history of diverticulitis, hyperlipidemia.  She is here today with complaints of generalized abdominal pain.  She states that 3 weeks ago she started having generalized abdominal pain that began in her epigastrium and radiated all the way down to the pubic area.  She states that initially she had a little bit of nausea and 2 or 3 episodes of diarrhea.  Now the pain has subsided and is more just like a constant toothache.  She denies any further nausea and her bowel movements have been normal.  She denies seeing blood in her stools.  She says that it feels reminiscent of diverticulitis that she had in the past.  CT scan of abdomen and pelvis with contrast in December 2021 was unremarkable.  EGD December 20 21 to 2 cm hiatal hernia and erythema in the gastric fundus.  She was found to have H. pylori and was treated with Talicia.  Follow-up breath test was negative.  Her last colonoscopy was in 2019, March which showed a transverse 5 mm polyp found to be adenomatous.  Scattered diverticulosis and internal hemorrhoids.   Past Medical History:  Diagnosis Date  . Allergic rhinitis   . Arthritis   . Asthma   . Cataract 01/2018  . GERD (gastroesophageal reflux disease)   . H. pylori infection   . Hiatal hernia   . History of adenomatous polyp of colon   . History of blood transfusion   . Hyperlipidemia   . Internal hemorrhoids   . Osteoporosis   . Paroxysmal supraventricular tachycardia (HCC)   . Wound infection after surgery 03/2016   Past Surgical History:  Procedure Laterality Date  . ANKLE FRACTURE SURGERY Left 12/2015  . APPENDECTOMY    . BILATERAL SALPINGOOPHORECTOMY    . COLONOSCOPY  02/2108   polyps removed   . I & D EXTREMITY N/A 03/12/2016   Procedure: IRRIGATION AND DEBRIDEMENT EXTREMITY;  Surgeon: Toni Arthurs, MD;  Location: [facility] OR;  Service: Orthopedics;  Laterality: N/A;  . squamous cell cancer face    . TOE SURGERY    . TONSILLECTOMY    . TOTAL ABDOMINAL HYSTERECTOMY    . vocal cord nodules      reports that she has never smoked. She has never used smokeless tobacco. She reports that she does not drink alcohol and does not use drugs. family history includes CVA in her father and mother; Heart disease in her father. Allergies  Allergen Reactions  . Keflex [Cephalexin] Hives  . Ciprofloxacin Other (See Comments)    "bothered achilles tendon"  . Penicillins Rash  . Sulfonamide Derivatives Rash      Outpatient Encounter Medications as of 04/04/2021  Medication Sig  . albuterol (PROVENTIL HFA) 108 (90 Base) MCG/ACT inhaler Inhale 2 puffs into the lungs every 6 (six) hours as needed for wheezing or shortness of breath.  Marland Kitchen atorvastatin (LIPITOR) 10 MG tablet Take 1 tablet by mouth at bedtime.  Marland Kitchen azelastine (ASTELIN) 0.1 % nasal spray Place 1 spray into both nostrils at bedtime.  . carvedilol (COREG) 6.25 MG tablet Take 2 tablets by mouth daily.  . fluticasone (FLONASE) 50 MCG/ACT nasal spray Place 2 sprays into the nose every evening.  . fluticasone (FLOVENT HFA) 110 MCG/ACT inhaler Inhale 2 puffs  into the lungs 2 (two) times daily.  . montelukast (SINGULAIR) 10 MG tablet Take 10 mg by mouth at bedtime.  . Multiple Vitamins-Minerals (PRESERVISION AREDS 2) CAPS Take 2 capsules by mouth daily.  . pantoprazole (PROTONIX) 40 MG tablet TAKE 1 TABLET(40 MG) BY MOUTH DAILY. MUST HAVE OFFICE VISIT FOR FURTHER REFILLS  . Probiotic Product (PROBIOTIC-10) CAPS Take 1 capsule by mouth daily.  . Vitamin D, Ergocalciferol, (DRISDOL) 50000 units CAPS capsule Take 1 capsule by mouth once a week.   No facility-administered encounter medications on file as of 04/04/2021.     REVIEW OF SYSTEMS  : All  other systems reviewed and negative except where noted in the History of Present Illness.   PHYSICAL EXAM: BP 124/64 (BP Location: Right Arm, Patient Position: Sitting, Cuff Size: Normal)   Pulse 64   Ht 5' 5.5" (1.664 m)   Wt 164 lb (74.4 kg)   BMI 26.88 kg/m  General: Well developed white female in no acute distress Head: Normocephalic and atraumatic Eyes:  Sclerae anicteric, conjunctiva pink. Ears: Normal auditory acuity Lungs: Clear throughout to auscultation; no W/R/R. Heart: Regular rate and rhythm; no M/R/G. Abdomen: Soft, non-distended.  BS present.  Mild LLQ and epigastric TTP. Musculoskeletal: Symmetrical with no gross deformities  Skin: No lesions on visible extremities Extremities: No edema  Neurological: Alert oriented x 4, grossly non-focal Psychological:  Alert and cooperative. Normal mood and affect  ASSESSMENT AND PLAN: *80 year old female with complaints of generalized abdominal pain for the past 3 weeks.  It has subsided and now is described more like a constant toothache.  She had some nausea and diarrhea in the beginning, but now her stools are normal.  She says it feels reminiscent to diverticulitis that she had in the past.  She is mildly tender in left lower quadrant and epigastrium.  I am going to treat her with a course of Flagyl 550 mg 3 times daily for 10 days.  She is allergic to penicillin so cannot take Augmentin (although was treated with Phoebe Perch for [facility]) and she has had issues with her Achilles tendon with Cipro in the past and actually just had Achilles tendon surgery.  Prescription sent to pharmacy.  I figured that this would help if she has any smoldering diverticulitis and also help for underlying SIBO or if this was some type of postinfectious issue related to bacterial gastroenteritis.  She is already on probiotics and will continue those.  We discussed low residue diet for the next several days.  She will call us back towards end of her course give  [place] an update on her symptoms.  She will certainly call back in the interim if she worsens.   CC:  Theodoro Kos, MD

## 2021-04-09 ENCOUNTER — Telehealth: Payer: Self-pay | Admitting: Gastroenterology

## 2021-04-09 NOTE — Telephone Encounter (Signed)
The pt was last seen on 4/28 by Doug Sou PA- see below;  ASSESSMENT AND PLAN: *80 year old female with complaints of generalized abdominal pain for the past 3 weeks.  It has subsided and now is described more like a constant toothache.  She had some nausea and diarrhea in the beginning, but now her stools are normal.  She says it feels reminiscent to diverticulitis that she had in the past.  She is mildly tender in left lower quadrant and epigastrium.  I am going to treat her with a course of Flagyl 550 mg 3 times daily for 10 days.  She is allergic to penicillin so cannot take Augmentin (although was treated with Cathy [name] for [facility]) and she has had issues with her Achilles tendon with Cipro in the past and actually just had Achilles tendon surgery.  Prescription sent to pharmacy.  I figured that this would help if she has any smoldering diverticulitis and also help for underlying SIBO or if this was some type of postinfectious issue related to bacterial gastroenteritis.  She is already on probiotics and will continue those.  We discussed low residue diet for the next several days.  She will call us back towards end of her course give [place] an update on her symptoms.  She will certainly call back in the interim if she worsens.  She is calling today with complaints of flagyl making her nauseous, headaches, fatigue and leg weakness.  She had only taken for 3 days; she stopped after the 3rd day due to the severity of the symptoms.  She does state that she does not believe she is having any symptoms of diverticulitis at this time.  She also reports worse GERD.  She is taking protonix 40 mg once daily.  She will add pepcid at bedtime and wait for further recommendations.

## 2021-04-09 NOTE — Telephone Encounter (Signed)
Inbound call from patient requesting a call from a nurse please.  States she has been experiencing nausea, headaches and stomach aches.  Please advise.

## 2021-04-09 NOTE — Telephone Encounter (Signed)
The pt has been advised of the medication changes and addition of Pepcid.  She will call back in a few weeks to update in her condition.

## 2021-04-17 NOTE — Progress Notes (Signed)
Addendum: Reviewed and agree with assessment and management plan. Flagyl dose noted to be 500 mg TID x 10 days. Dao Mearns, Carie Caddy, MD

## 2021-09-09 ENCOUNTER — Other Ambulatory Visit: Payer: Self-pay | Admitting: Internal Medicine

## 2021-09-09 DIAGNOSIS — Z1231 Encounter for screening mammogram for malignant neoplasm of breast: Secondary | ICD-10-CM

## 2021-10-08 ENCOUNTER — Ambulatory Visit (INDEPENDENT_AMBULATORY_CARE_PROVIDER_SITE_OTHER): Payer: Medicare Other | Admitting: Pulmonary Disease

## 2021-10-08 ENCOUNTER — Ambulatory Visit (INDEPENDENT_AMBULATORY_CARE_PROVIDER_SITE_OTHER): Payer: Medicare Other

## 2021-10-08 ENCOUNTER — Encounter: Payer: Self-pay | Admitting: Pulmonary Disease

## 2021-10-08 ENCOUNTER — Institutional Professional Consult (permissible substitution): Payer: Medicare Other | Admitting: Pulmonary Disease

## 2021-10-08 ENCOUNTER — Other Ambulatory Visit: Payer: Self-pay

## 2021-10-08 VITALS — BP 122/70 | HR 60 | Ht 65.5 in | Wt 173.6 lb

## 2021-10-08 DIAGNOSIS — J4520 Mild intermittent asthma, uncomplicated: Secondary | ICD-10-CM

## 2021-10-08 DIAGNOSIS — R0683 Snoring: Secondary | ICD-10-CM

## 2021-10-08 MED ORDER — BUDESONIDE-FORMOTEROL FUMARATE 160-4.5 MCG/ACT IN AERO: 2.0000 | INHALATION_SPRAY | Freq: Two times a day (BID) | RESPIRATORY_TRACT | 6 refills | Status: DC

## 2021-10-08 NOTE — Patient Instructions (Addendum)
Start symbicort inhaler 2 puffs twice daily - rinse mouth out after each use  Use albuterol as needed 1-2 puffs every 4-6 hours as needed  We will check a chest x-ray today  We will schedule you for pulmonary function tests at your convenience and call you with the results  We will schedule you for a home sleep study to evaluate your snoring.

## 2021-10-08 NOTE — Progress Notes (Signed)
Synopsis: Referred in November 2022 for asthma and shortness of breath. Former patient of Dr. Maple Hudson.  Subjective:   PATIENT ID: Cathy Boast GENDER: female DOB: [birth year], MRN: [id number]   HPI  Chief Complaint  Patient presents with   Consult    Self referral for asthma. States she has noticed that her SOB has increased over the last year, especially with exertion.    Cathy [name] is a 80 year old woman, never smoker with history of asthma, GERD and allergic rhinitis who reports increasing shortness of breath since having covid in June 2021.   She was ill with covid in June 2021 with mild symptoms and did not require hospitalization. Since this time she has noticed exertional dyspnea. 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 She denies any cough, wheezing or chest tightness with the dyspnea. She is using flovent 2 puffs twice daily, singulair daily and as needed albuterol inhaler. She continues to experience GERD symptoms despite being on PPI therapy. She does have sinus congestion and runny nose with complaints of night time post nasal drainage. She uses astelin nasal spray nightly.   She is a never smoker and denies second hand smoke exposure. No harmful dust or chemical exposures through her work.   She does report snoring at night. She does have daytime fatigue.   Past Medical History:  Diagnosis Date   Allergic rhinitis    Arthritis    Asthma    Cataract 01/2018   GERD (gastroesophageal reflux disease)    H. pylori infection    Hiatal hernia    History of adenomatous polyp of colon    History of blood transfusion    Hyperlipidemia    Internal hemorrhoids    Osteoporosis    Paroxysmal supraventricular tachycardia (HCC)    Wound infection after surgery 03/2016     Family History  Problem Relation Age of Onset   Heart disease Father    CVA Father    CVA Mother    Colon cancer  Neg Hx    Pancreatic cancer Neg Hx    Rectal cancer Neg Hx    Stomach cancer Neg Hx    Esophageal cancer Neg Hx      Social History   Socioeconomic History   Marital status: Married    Spouse name: Not on file   Number of children: Not on file   Years of education: Not on file   Highest education level: Not on file  Occupational History   Occupation: Architect  Tobacco Use   Smoking status: Never   Smokeless tobacco: Never  Vaping Use   Vaping Use: Never used  Substance and Sexual Activity   Alcohol use: No    Alcohol/week: 0.0 standard drinks   Drug use: No   Sexual activity: Not on file  Other Topics Concern   Not on file  Social History Narrative   Not on file   Social Determinants of Health   Financial Resource Strain: Not on file  Food Insecurity: Not on file  Transportation Needs: Not on file  Physical Activity: Not on file  Stress: Not on file  Social Connections: Not on file  Intimate Partner Violence: Not on file     Allergies  Allergen Reactions   Keflex [Cephalexin] Hives   Ciprofloxacin Other (See Comments)    "bothered achilles tendon"   Penicillins Rash   Sulfonamide Derivatives  Rash     Outpatient Medications Prior to Visit  Medication Sig Dispense Refill   albuterol (PROVENTIL HFA) 108 (90 Base) MCG/ACT inhaler Inhale 2 puffs into the lungs every 6 (six) hours as needed for wheezing or shortness of breath. 1 Inhaler prn   atorvastatin (LIPITOR) 10 MG tablet Take 1 tablet by mouth at bedtime.     azelastine (ASTELIN) 0.1 % nasal spray Place 1 spray into both nostrils at bedtime.  11   carvedilol (COREG) 6.25 MG tablet Take 2 tablets by mouth daily.     levothyroxine (SYNTHROID) 50 MCG tablet Take 50 mcg by mouth.     montelukast (SINGULAIR) 10 MG tablet Take 10 mg by mouth at bedtime.     pantoprazole (PROTONIX) 40 MG tablet Take by mouth.     Probiotic Product (PROBIOTIC-10) CAPS Take 1 capsule by mouth daily.     Vitamin D,  Ergocalciferol, (DRISDOL) 50000 units CAPS capsule Take 1 capsule by mouth once a week.     fluticasone (FLONASE) 50 MCG/ACT nasal spray Place 2 sprays into the nose every evening.     fluticasone (FLOVENT HFA) 110 MCG/ACT inhaler Inhale 2 puffs into the lungs 2 (two) times daily. 3 Inhaler 3   metroNIDAZOLE (FLAGYL) 500 MG tablet Take 1 tablet (500 mg total) by mouth 3 (three) times daily. 30 tablet 0   Multiple Vitamins-Minerals (PRESERVISION AREDS 2) CAPS Take 2 capsules by mouth daily.     pantoprazole (PROTONIX) 40 MG tablet TAKE 1 TABLET(40 MG) BY MOUTH DAILY. MUST HAVE OFFICE VISIT FOR FURTHER REFILLS 90 tablet 0   No facility-administered medications prior to visit.   Review of Systems  Constitutional:  Negative for chills, fever, malaise/fatigue and weight loss.  HENT:  Negative for congestion, sinus pain and sore throat.   Eyes: Negative.   Respiratory:  Positive for shortness of breath. Negative for cough, hemoptysis, sputum production and wheezing.   Cardiovascular:  Negative for chest pain, palpitations, orthopnea, claudication and leg swelling.  Gastrointestinal:  Negative for abdominal pain, heartburn, nausea and vomiting.  Genitourinary: Negative.   Musculoskeletal:  Negative for joint pain and myalgias.  Skin:  Negative for rash.  Neurological:  Negative for weakness.  Endo/Heme/Allergies: Negative.   Psychiatric/Behavioral: Negative.     Objective:   Vitals:   10/08/21 1536  BP: 122/70  Pulse: 60  SpO2: 97%  Weight: 173 lb 9.6 oz (78.7 kg)  Height: 5' 5.5" (1.664 m)   Physical Exam Constitutional:      General: She is not in acute distress.    Appearance: She is not ill-appearing.  HENT:     Head: Normocephalic and atraumatic.  Eyes:     General: No scleral icterus.    Conjunctiva/sclera: Conjunctivae normal.     Pupils: Pupils are equal, round, and reactive to light.  Cardiovascular:     Rate and Rhythm: Normal rate and regular rhythm.     Pulses:  Normal pulses.     Heart sounds: Normal heart sounds. No murmur heard. Pulmonary:     Effort: Pulmonary effort is normal.     Breath sounds: Normal breath sounds. No wheezing, rhonchi or rales.  Abdominal:     General: Bowel sounds are normal.     Palpations: Abdomen is soft.  Musculoskeletal:     Right lower leg: No edema.     Left lower leg: No edema.  Lymphadenopathy:     Cervical: No cervical adenopathy.  Skin:    General: Skin is  warm and dry.  Neurological:     General: No focal deficit present.     Mental Status: She is alert.  Psychiatric:        Mood and Affect: Mood normal.        Behavior: Behavior normal.        Thought Content: Thought content normal.        Judgment: Judgment normal.   CBC    Component Value Date/Time   WBC 8.3 11/20/2020 1049   RBC 4.38 11/20/2020 1049   HGB 13.4 11/20/2020 1049   HCT 39.6 11/20/2020 1049   PLT 200.0 11/20/2020 1049   MCV 90.6 11/20/2020 1049   MCH 29.3 03/11/2016 1735   MCHC 33.8 11/20/2020 1049   RDW 13.2 11/20/2020 1049   LYMPHSABS 2.1 11/20/2020 1049   MONOABS 0.7 11/20/2020 1049   EOSABS 0.2 11/20/2020 1049   BASOSABS 0.0 11/20/2020 1049   BMP Latest Ref Rng & Units 11/20/2020 11/11/2016 07/02/2016  Glucose 70 - 99 mg/dL 91 419(F) 790(W)  BUN 6 - 23 mg/dL 20 18 23   Creatinine 0.40 - 1.20 mg/dL 4.09 7.35(H) 2.99(M)  Sodium 135 - 145 mEq/L 138 139 141  Potassium 3.5 - 5.1 mEq/L 4.0 3.9 4.4  Chloride 96 - 112 mEq/L 104 105 104  CO2 19 - 32 mEq/L 25 25 24   Calcium 8.4 - 10.5 mg/dL 9.1 8.9 9.3   Chest imaging: CXR 10/08/21 Increased interstitial markings bilaterally. No effusion or infiltrates noted.  CT Abdomen Pelvis 11/22/21 Lower lung fields unremarkable  PFT: No flowsheet data found.  Labs:  Path:  Echo 03/29/21: LV EF 60-65%. LV diastolic function shows grade I diastolic dysfunction. RV systolic function is normal. RV size is normal. PASP . Left atrial size is mildly dilated. Small pericardial  effusion is present anteriorly.  NM Myocardial Stress Test 03/29/2021: No diagnostic ST segment changes to indicate ischemia. No arrhythmias. No significant myocardial perfusion defects on stress imaging to indicate ischemia. There is evidence of soft tissue attenuation affecting the inferior septal wall on rest imaging, also radiotracer uptake within the gut. This is a low risk study. Nuclear stress EF: 69%.  Assessment & Plan:   Mild intermittent asthma without complication - Plan: budesonide-formoterol (SYMBICORT) 160-4.5 MCG/ACT inhaler, Pulmonary Function Test, DG Chest 2 View  Snoring - Plan: Home sleep test  Discussion: Cathy [name] is a 80 year old woman, never smoker with history of allergic asthma who returns to pulmonary clinic for progressive dyspnea over the last year.  She had covid 19 infection 05/2020 with increasing dyspnea despite flovent 2 puffs twice daily. There is concern for possible uncontrolled asthma symptoms since her viral infection or post-covid fibrotic changes.   Chest radiograph is concerning for increased interstitial markings bilaterally. Will await final radiology read. If her PFTs are abnormal we will consider getting a HRCT chest for further evaluation.   She does report significant snoring at night. We will check home sleep study for further evaluation of sleep disordered breathing.   Follow up in 3 months.   Melody Comas, MD [facility] Office: [phone]    Current Outpatient Medications:    albuterol (PROVENTIL HFA) 108 (90 Base) MCG/ACT inhaler, Inhale 2 puffs into the lungs every 6 (six) hours as needed for wheezing or shortness of breath., Disp: 1 Inhaler, Rfl: prn   atorvastatin (LIPITOR) 10 MG tablet, Take 1 tablet by mouth at bedtime., Disp: , Rfl:    azelastine (ASTELIN) 0.1 % nasal  spray, Place 1 spray into both nostrils at bedtime., Disp: , Rfl: 11   budesonide-formoterol (SYMBICORT) 160-4.5  MCG/ACT inhaler, Inhale 2 puffs into the lungs 2 (two) times daily., Disp: 1 each, Rfl: 6   carvedilol (COREG) 6.25 MG tablet, Take 2 tablets by mouth daily., Disp: , Rfl:    levothyroxine (SYNTHROID) 50 MCG tablet, Take 50 mcg by mouth., Disp: , Rfl:    montelukast (SINGULAIR) 10 MG tablet, Take 10 mg by mouth at bedtime., Disp: , Rfl:    pantoprazole (PROTONIX) 40 MG tablet, Take by mouth., Disp: , Rfl:    Probiotic Product (PROBIOTIC-10) CAPS, Take 1 capsule by mouth daily., Disp: , Rfl:    Vitamin D, Ergocalciferol, (DRISDOL) 50000 units CAPS capsule, Take 1 capsule by mouth once a week., Disp: , Rfl:

## 2021-10-10 ENCOUNTER — Ambulatory Visit (INDEPENDENT_AMBULATORY_CARE_PROVIDER_SITE_OTHER): Payer: Medicare Other | Admitting: Pulmonary Disease

## 2021-10-10 ENCOUNTER — Other Ambulatory Visit: Payer: Self-pay

## 2021-10-10 DIAGNOSIS — J4520 Mild intermittent asthma, uncomplicated: Secondary | ICD-10-CM | POA: Diagnosis not present

## 2021-10-10 LAB — PULMONARY FUNCTION TEST
DL/VA % pred: 73 %
DL/VA: 3.02 ml/min/mmHg/L
DLCO cor % pred: 70 %
DLCO cor: 13.42 ml/min/mmHg
DLCO unc % pred: 70 %
DLCO unc: 13.42 ml/min/mmHg
FEF 25-75 Post: 2.85 L/sec
FEF 25-75 Pre: 2.3 L/sec
FEF2575-%Change-Post: 23 %
FEF2575-%Pred-Post: 188 %
FEF2575-%Pred-Pre: 152 %
FEV1-%Change-Post: 2 %
FEV1-%Pred-Post: 118 %
FEV1-%Pred-Pre: 114 %
FEV1-Post: 2.38 L
FEV1-Pre: 2.31 L
FEV1FVC-%Change-Post: 2 %
FEV1FVC-%Pred-Pre: 108 %
FEV6-%Change-Post: 0 %
FEV6-%Pred-Post: 112 %
FEV6-%Pred-Pre: 111 %
FEV6-Post: 2.87 L
FEV6-Pre: 2.85 L
FEV6FVC-%Pred-Post: 105 %
FEV6FVC-%Pred-Pre: 105 %
FVC-%Change-Post: 0 %
FVC-%Pred-Post: 106 %
FVC-%Pred-Pre: 105 %
FVC-Post: 2.87 L
FVC-Pre: 2.85 L
Post FEV1/FVC ratio: 83 %
Post FEV6/FVC ratio: 100 %
Pre FEV1/FVC ratio: 81 %
Pre FEV6/FVC Ratio: 100 %
RV % pred: 105 %
RV: 2.49 L
TLC % pred: 110 %
TLC: 5.57 L

## 2021-10-10 NOTE — Progress Notes (Signed)
PFT done today. 

## 2021-10-14 ENCOUNTER — Ambulatory Visit: Payer: Medicare Other

## 2021-10-15 ENCOUNTER — Encounter: Payer: Self-pay | Admitting: *Deleted

## 2021-10-15 DIAGNOSIS — R9389 Abnormal findings on diagnostic imaging of other specified body structures: Secondary | ICD-10-CM

## 2021-10-15 DIAGNOSIS — J4520 Mild intermittent asthma, uncomplicated: Secondary | ICD-10-CM

## 2021-10-16 ENCOUNTER — Other Ambulatory Visit: Payer: Self-pay

## 2021-10-16 ENCOUNTER — Ambulatory Visit
Admission: RE | Admit: 2021-10-16 | Discharge: 2021-10-16 | Disposition: A | Discharge status: Home / Self Care | Payer: Medicare Other | Source: Ambulatory Visit | Attending: Internal Medicine | Admitting: Internal Medicine

## 2021-10-16 DIAGNOSIS — Z1231 Encounter for screening mammogram for malignant neoplasm of breast: Secondary | ICD-10-CM

## 2021-10-29 ENCOUNTER — Other Ambulatory Visit: Payer: Self-pay

## 2021-10-29 ENCOUNTER — Ambulatory Visit (HOSPITAL_COMMUNITY)
Admission: RE | Admit: 2021-10-29 | Discharge: 2021-10-29 | Disposition: A | Discharge status: Home / Self Care | Payer: Medicare Other | Source: Ambulatory Visit | Attending: Pulmonary Disease | Admitting: Pulmonary Disease

## 2021-10-29 DIAGNOSIS — R9389 Abnormal findings on diagnostic imaging of other specified body structures: Secondary | ICD-10-CM | POA: Diagnosis not present

## 2021-11-04 ENCOUNTER — Telehealth: Payer: Self-pay | Admitting: Pulmonary Disease

## 2021-11-04 NOTE — Telephone Encounter (Signed)
Spoke to patient, who is requesting CT results.  She is okay with waiting until 11/06/21 for a response, as Dr. Francine Graven is unavailable.    Dr. Francine Graven, please advise. Thanks

## 2021-11-08 NOTE — Telephone Encounter (Signed)
I tried calling the pt and there was no answer- LMTCB. 

## 2021-11-08 NOTE — Telephone Encounter (Signed)
The symbicort could be leading to hoarse voice.   She can be changed to stiolto 2 puffs twice daily for her breathing and stop the symbicort if she would like.  Thanks, Cletis Athens

## 2021-11-08 NOTE — Telephone Encounter (Signed)
Patient checking on results of CT scan. Phone number is [phone].

## 2021-11-08 NOTE — Telephone Encounter (Signed)
Dr Francine Graven please advise: Called and spoke to patient about test results.  Pt states since starting Symbicort it has caused her to become hoarse, she does rinse her mouth out with water every use. I asked if she was adding baking soda to the water to help with the residue more and she stated no. Patient states she will try adding baking soda to the water.   Patient would also like to know if Symbicort is the cause of her problem ? Pt  wants to know if you would like her to continue Symbicort and or to go back to Flovent?

## 2021-11-11 NOTE — Telephone Encounter (Signed)
Called and spoke to patient, patient states since adding baking soda to her water the hoarseness has resolved, she would like to continue using the Symbicort.  Appt was set for 1/9 @ 1:30pm . Nothing further needed.

## 2021-11-20 ENCOUNTER — Telehealth: Payer: Self-pay | Admitting: Pulmonary Disease

## 2021-11-20 NOTE — Telephone Encounter (Signed)
Called patient but she did not answer. Left message for her to call back.  

## 2021-11-22 ENCOUNTER — Ambulatory Visit (HOSPITAL_COMMUNITY): Payer: Medicare Other

## 2021-11-26 NOTE — Telephone Encounter (Signed)
Change inhaler to stiolto 2 puffs twice daily.  Stop symbicort inhaler.  JD

## 2021-11-26 NOTE — Telephone Encounter (Signed)
Called the pt and there was no answer and unfortunately her mailbox was full. Will call back. Pended Stiolto rx.

## 2021-11-26 NOTE — Telephone Encounter (Signed)
Called and spoke with pt who states the Symbicort inhaler has been causing her to have a hoarse voice and wants to know if there is a different inhaler that she might be able to be switched to in place of the Symbicort. Dr. Francine Graven, please advise.

## 2021-11-26 NOTE — Telephone Encounter (Signed)
Correction:  Stiolto 2 puffs daily

## 2021-11-27 ENCOUNTER — Encounter: Payer: Self-pay | Admitting: *Deleted

## 2021-11-27 NOTE — Telephone Encounter (Signed)
Called the pt and there was no answer and vm still full  I called her home number and also no answer or option to leave msg  Will mail letter and close per protocol

## 2021-12-06 NOTE — Telephone Encounter (Signed)
I spoke with the patient and she reports that she is not feeling as hoarse on the Symbicort and wants to keep on this inhaler at this time. She will wait until she has the OV next month and will discuss any changes then. Nothing further needed.

## 2021-12-11 ENCOUNTER — Telehealth: Payer: Self-pay | Admitting: Pulmonary Disease

## 2021-12-11 MED ORDER — ALBUTEROL SULFATE HFA 108 (90 BASE) MCG/ACT IN AERS: 1.0000 | INHALATION_SPRAY | Freq: Four times a day (QID) | RESPIRATORY_TRACT | 5 refills | Status: DC | PRN

## 2021-12-11 NOTE — Telephone Encounter (Signed)
Called and spoke with pt letting her know the recs per JD and she verbalized understanding. Pt said that she needed to have her rescue inhaler renewed so Rx has been sent in for pt. Nothing further needed.

## 2021-12-11 NOTE — Telephone Encounter (Signed)
Called and spoke with pt who states she has been in a flare for 4 days. Pt states she has had a nonproductive cough, chest discomfort, and also has had a mild wheeze.  Pt denies any complaints of fever.  States that she has not had to use her rescue inhaler as she has mainly been laying down not doing anything.  Pt is using her symbicort inhaler twice a day as prescribed. Pt said that she has also been taking mucinex to see if it would help with her symptoms.  Pt wants to know if there is anything else that we could recommend to help with her symptoms. Dr. Francine Graven, please advise.

## 2021-12-11 NOTE — Telephone Encounter (Signed)
Recommend that she use her rescue inhaler for the cough, wheezing and chest discomfort as needed, 1-2 puffs every 4-6 hours as needed.  I would recommend that she elevate the head of her bed at night when sleeping to reduce nocturnal reflux symptoms. She can use a wedge pillow or place blocks under the head of her bed to elevate it 4-6 inches.   Thanks, JD

## 2021-12-16 ENCOUNTER — Other Ambulatory Visit: Payer: Self-pay

## 2021-12-16 ENCOUNTER — Ambulatory Visit (INDEPENDENT_AMBULATORY_CARE_PROVIDER_SITE_OTHER): Payer: Medicare Other | Admitting: Pulmonary Disease

## 2021-12-16 ENCOUNTER — Other Ambulatory Visit: Payer: Medicare Other

## 2021-12-16 ENCOUNTER — Encounter: Payer: Self-pay | Admitting: Pulmonary Disease

## 2021-12-16 VITALS — BP 118/72 | HR 55 | Ht 65.5 in | Wt 173.8 lb

## 2021-12-16 DIAGNOSIS — R682 Dry mouth, unspecified: Secondary | ICD-10-CM

## 2021-12-16 DIAGNOSIS — H04123 Dry eye syndrome of bilateral lacrimal glands: Secondary | ICD-10-CM | POA: Diagnosis not present

## 2021-12-16 DIAGNOSIS — J479 Bronchiectasis, uncomplicated: Secondary | ICD-10-CM | POA: Diagnosis not present

## 2021-12-16 MED ORDER — IPRATROPIUM-ALBUTEROL 0.5-2.5 (3) MG/3ML IN SOLN: 3.0000 mL | Freq: Two times a day (BID) | RESPIRATORY_TRACT | 1 refills | Status: DC

## 2021-12-16 NOTE — Progress Notes (Signed)
Synopsis: Referred in November 2022 for asthma and shortness of breath. Former patient of Dr. Annamaria Boots.  Subjective:   PATIENT ID: Cathy [name] GENDER: female DOB: 1941-03-28, MRN: [id number]  HPI  Chief Complaint  Patient presents with   Follow-up    2 mo f/u for asthma. Had a flare up last week. States she is still coughing, productive cough with yellowish phlegm.    Cathy [name] is a 81 year old woman, never smoker with history of asthma, GERD and allergic rhinitis who reports increasing shortness of breath since having covid in June 2021.   She has not noticed improvement in her shortness of breath since starting symbicort at last visit. She called on 1/4 with complaints of increased cough. She was encouraged to use her albuterol inhaler and sleep with the head of the bed elevated given her history of GERD with some improvement in the cough. She does have sinus congestion with post nasal drainage.  HRCT Chest on 10/29/21 showed mild scattered areas of cylindrical bronchiecatsis with associated thickening of the peribronchovascular interstitium and regional architectural distortion with volume loss in the medial aspect of right upper lobe and inferior segment of the left upper lobe and anterior aspect of the left lower lobe.   She does complain of dry eyes and mouth. Denies joint pains.  OV 10/08/21 She was ill with covid in June 2021 with mild symptoms and did not require hospitalization. Since this time she has noticed exertional dyspnea. 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 She denies any cough, wheezing or chest tightness with the dyspnea. She is using flovent 176mcg 2 puffs twice daily, singulair daily and as needed albuterol inhaler. She continues to experience GERD symptoms despite being on PPI therapy. She does have sinus congestion and runny nose with complaints of night time post nasal drainage.  She uses astelin nasal spray nightly.   She is a never smoker and denies second hand smoke exposure. No harmful dust or chemical exposures through her work.   She does report snoring at night. She does have daytime fatigue.   Past Medical History:  Diagnosis Date   Allergic rhinitis    Arthritis    Asthma    Cataract 01/2018   GERD (gastroesophageal reflux disease)    H. pylori infection    Hiatal hernia    History of adenomatous polyp of colon    History of blood transfusion    Hyperlipidemia    Internal hemorrhoids    Osteoporosis    Paroxysmal supraventricular tachycardia (HCC)    Wound infection after surgery 03/2016     Family History  Problem Relation Age of Onset   Heart disease Father    CVA Father    CVA Mother    Colon cancer Neg Hx    Pancreatic cancer Neg Hx    Rectal cancer Neg Hx    Stomach cancer Neg Hx    Esophageal cancer Neg Hx      Social History   Socioeconomic History   Marital status: Married    Spouse name: Not on file   Number of children: Not on file   Years of education: Not on file   Highest education level: Not on file  Occupational History   Occupation: Solicitor  Tobacco Use   Smoking status: Never   Smokeless tobacco: Never  Vaping Use   Vaping Use: Never used  Substance  and Sexual Activity   Alcohol use: No    Alcohol/week: 0.0 standard drinks   Drug use: No   Sexual activity: Not on file  Other Topics Concern   Not on file  Social History Narrative   Not on file   Social Determinants of Health   Financial Resource Strain: Not on file  Food Insecurity: Not on file  Transportation Needs: Not on file  Physical Activity: Not on file  Stress: Not on file  Social Connections: Not on file  Intimate Partner Violence: Not on file     Allergies  Allergen Reactions   Keflex [Cephalexin] Hives   Ciprofloxacin Other (See Comments)    "bothered achilles tendon"   Penicillins Rash   Sulfonamide Derivatives Rash      Outpatient Medications Prior to Visit  Medication Sig Dispense Refill   albuterol (PROVENTIL HFA) 108 (90 Base) MCG/ACT inhaler Inhale 1-2 puffs into the lungs every 6 (six) hours as needed for wheezing or shortness of breath. 8 g 5   atorvastatin (LIPITOR) 10 MG tablet Take 1 tablet by mouth at bedtime.     azelastine (ASTELIN) 0.1 % nasal spray Place 1 spray into both nostrils at bedtime.  11   budesonide-formoterol (SYMBICORT) 160-4.5 MCG/ACT inhaler Inhale 2 puffs into the lungs 2 (two) times daily. 1 each 6   carvedilol (COREG) 6.25 MG tablet Take 2 tablets by mouth daily.     levothyroxine (SYNTHROID) 50 MCG tablet Take 50 mcg by mouth.     montelukast (SINGULAIR) 10 MG tablet Take 10 mg by mouth at bedtime.     pantoprazole (PROTONIX) 40 MG tablet Take by mouth.     Probiotic Product (PROBIOTIC-10) CAPS Take 1 capsule by mouth daily.     Vitamin D, Ergocalciferol, (DRISDOL) 50000 units CAPS capsule Take 1 capsule by mouth once a week.     No facility-administered medications prior to visit.   Review of Systems  Constitutional:  Negative for chills, fever, malaise/fatigue and weight loss.  HENT:  Negative for congestion, sinus pain and sore throat.   Eyes: Negative.   Respiratory:  Positive for cough, sputum production and shortness of breath. Negative for hemoptysis and wheezing.   Cardiovascular:  Negative for chest pain, palpitations, orthopnea, claudication and leg swelling.  Gastrointestinal:  Negative for abdominal pain, heartburn, nausea and vomiting.  Genitourinary: Negative.   Musculoskeletal:  Negative for joint pain and myalgias.  Skin:  Negative for rash.  Neurological:  Negative for weakness.  Endo/Heme/Allergies: Negative.   Psychiatric/Behavioral: Negative.     Objective:   Vitals:   12/16/21 1328  BP: 118/72  Pulse: (!) 55  SpO2: 100%  Weight: 173 lb 12.8 oz (78.8 kg)  Height: 5' 5.5" (1.664 m)   Physical Exam Constitutional:      General: She is not  in acute distress.    Appearance: She is not ill-appearing.  HENT:     Head: Normocephalic and atraumatic.  Eyes:     General: No scleral icterus.    Conjunctiva/sclera: Conjunctivae normal.  Cardiovascular:     Rate and Rhythm: Normal rate and regular rhythm.     Pulses: Normal pulses.     Heart sounds: Normal heart sounds. No murmur heard. Pulmonary:     Effort: Pulmonary effort is normal.     Breath sounds: Normal breath sounds. No wheezing, rhonchi or rales.  Musculoskeletal:     Right lower leg: No edema.     Left lower leg: No edema.  Skin:    General: Skin is warm and dry.  Neurological:     General: No focal deficit present.     Mental Status: She is alert.  Psychiatric:        Mood and Affect: Mood normal.        Behavior: Behavior normal.        Thought Content: Thought content normal.        Judgment: Judgment normal.   CBC    Component Value Date/Time   WBC 8.3 11/20/2020 1049   RBC 4.38 11/20/2020 1049   HGB 13.4 11/20/2020 1049   HCT 39.6 11/20/2020 1049   PLT 200.0 11/20/2020 1049   MCV 90.6 11/20/2020 1049   MCH 29.3 03/11/2016 1735   MCHC 33.8 11/20/2020 1049   RDW 13.2 11/20/2020 1049   LYMPHSABS 2.1 11/20/2020 1049   MONOABS 0.7 11/20/2020 1049   EOSABS 0.2 11/20/2020 1049   BASOSABS 0.0 11/20/2020 1049   BMP Latest Ref Rng & Units 11/20/2020 11/11/2016 07/02/2016  Glucose 70 - 99 mg/dL 91 126(H) 102(H)  BUN 6 - 23 mg/dL 20 18 23   Creatinine 0.40 - 1.20 mg/dL 0.87 0.97(H) 0.97(H)  Sodium 135 - 145 mEq/L 138 139 141  Potassium 3.5 - 5.1 mEq/L 4.0 3.9 4.4  Chloride 96 - 112 mEq/L 104 105 104  CO2 19 - 32 mEq/L 25 25 24   Calcium 8.4 - 10.5 mg/dL 9.1 8.9 9.3   Chest imaging: HRCT Chest 10/29/21 Mediastinum/Nodes: No pathologically enlarged mediastinal or hilar lymph nodes. Please note that accurate exclusion of hilar adenopathy is limited on noncontrast CT scans. Esophagus is unremarkable in appearance. No axillary lymphadenopathy.    Lungs/Pleura: High-resolution images demonstrates some scattered areas of mild cylindrical bronchiectasis with associated thickening of the peribronchovascular interstitium and regional architectural distortion and volume loss, most evident in the medial segment of the right middle lobe, medial aspect of the right upper lobe, inferior segment of the left upper lobe and anterior aspect of the left lower lobe. No generalized areas of ground-glass attenuation, septal thickening or honeycombing are otherwise noted. Inspiratory and expiratory imaging demonstrates some mild air trapping indicative of small airways disease. No acute consolidative airspace disease. No pleural effusions. No definite suspicious appearing pulmonary nodules or masses are noted.   CXR 10/08/21 Increased interstitial markings bilaterally. No effusion or infiltrates noted.  CT Abdomen Pelvis 11/22/21 Lower lung fields unremarkable  PFT: PFT Results Latest Ref Rng & Units 10/10/2021  FVC-Pre L 2.85  FVC-Predicted Pre % 105  FVC-Post L 2.87  FVC-Predicted Post % 106  Pre FEV1/FVC % % 81  Post FEV1/FCV % % 83  FEV1-Pre L 2.31  FEV1-Predicted Pre % 114  FEV1-Post L 2.38  DLCO uncorrected ml/min/mmHg 13.42  DLCO UNC% % 70  DLCO corrected ml/min/mmHg 13.42  DLCO COR %Predicted % 70  DLVA Predicted % 73  TLC L 5.57  TLC % Predicted % 110  RV % Predicted % 105  Mild diffusion defect  Labs:  Path:  Echo 03/29/21: LV EF 60-65%. LV diastolic function shows grade I diastolic dysfunction. RV systolic function is normal. RV size is normal. PASP 7mmHg. Left atrial size is mildly dilated. Small pericardial effusion is present anteriorly.  NM Myocardial Stress Test 03/29/2021: No diagnostic ST segment changes to indicate ischemia. No arrhythmias. No significant myocardial perfusion defects on stress imaging to indicate ischemia. There is evidence of soft tissue attenuation affecting the inferior septal wall on rest  imaging, also radiotracer uptake within the  gut. This is a low risk study. Nuclear stress EF: 69%.  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  She did not respond to ICS/LABA inhaler therapy and developed increasing cough which could be secondary to the inhaler vs sinus congestion with post nasal drainage on chronic GERD.   She is to stop the symbicort inhaler. She is to use duoneb nebulizer treatments twice daily followed by flutter valve for the mild bronchiectasis noted on her recent CT Chest scan. Given her dry eyes/mouth we check ANA and SSA/SSB antibiotics which are negative.   Her bronchiectasis is possibly secondary to chronic reflux. She is to sleep with the head of her bed elevated to prevent nocturnal reflux issues.  Home sleep study has been ordered at last visit for concern of snoring.  Follow up in 4 months.   Freda Jackson, MD [facility] Office: [phone]    Current Outpatient Medications:    albuterol (PROVENTIL HFA) 108 (90 Base) MCG/ACT inhaler, Inhale 1-2 puffs into the lungs every 6 (six) hours as needed for wheezing or shortness of breath., Disp: 8 g, Rfl: 5   atorvastatin (LIPITOR) 10 MG tablet, Take 1 tablet by mouth at bedtime., Disp: , Rfl:    azelastine (ASTELIN) 0.1 % nasal spray, Place 1 spray into both nostrils at bedtime., Disp: , Rfl: 11   budesonide-formoterol (SYMBICORT) 160-4.5 MCG/ACT inhaler, Inhale 2 puffs into the lungs 2 (two) times daily., Disp: 1  each, Rfl: 6   carvedilol (COREG) 6.25 MG tablet, Take 2 tablets by mouth daily., Disp: , Rfl:    ipratropium-albuterol (DUONEB) 0.5-2.5 (3) MG/3ML SOLN, Take 3 mLs by nebulization 2 (two) times daily., Disp: 360 mL, Rfl: 1   levothyroxine (SYNTHROID) 50 MCG tablet, Take 50 mcg by mouth., Disp: , Rfl:    montelukast (SINGULAIR) 10 MG tablet, Take 10 mg by mouth at bedtime., Disp: , Rfl:    pantoprazole (PROTONIX) 40 MG tablet, Take by mouth., Disp: , Rfl:    Probiotic Product (PROBIOTIC-10) CAPS, Take 1 capsule by mouth daily., Disp: , Rfl:    Vitamin D, Ergocalciferol, (DRISDOL) 50000 units CAPS capsule, Take 1 capsule by mouth once a week., Disp: , Rfl:

## 2021-12-16 NOTE — Patient Instructions (Addendum)
Stop symbicort inhaler  Use duoneb nebulizer treatments twice daily follow by flutter valve  You have mild diffusion defect on your pulmonary function tests, which means oxygen is not traveling through your lung tissue into the blood cells as easily, which can cause shortness of breath.   Recommend sleeping with your head elevated about 4-6 inches to prevent night time reflux issues  We will check labs for possible Sjogren's disease

## 2021-12-18 ENCOUNTER — Encounter: Payer: Self-pay | Admitting: Pulmonary Disease

## 2021-12-18 LAB — ANA: Anti Nuclear Antibody (ANA): NEGATIVE

## 2021-12-18 LAB — SJOGREN'S SYNDROME ANTIBODS(SSA + SSB)
SSA (Ro) (ENA) Antibody, IgG: <1 AI
SSB (La) (ENA) Antibody, IgG: <1 AI

## 2021-12-19 ENCOUNTER — Telehealth: Payer: Self-pay | Admitting: Pulmonary Disease

## 2021-12-19 NOTE — Telephone Encounter (Signed)
Called patient but she did not answer. Left message for her to call back.  

## 2022-01-27 ENCOUNTER — Encounter: Payer: Self-pay | Admitting: Pulmonary Disease

## 2022-01-31 ENCOUNTER — Ambulatory Visit: Payer: Medicare Other

## 2022-01-31 ENCOUNTER — Other Ambulatory Visit: Payer: Self-pay

## 2022-01-31 DIAGNOSIS — R0683 Snoring: Secondary | ICD-10-CM

## 2022-02-07 DIAGNOSIS — R0683 Snoring: Secondary | ICD-10-CM | POA: Diagnosis not present

## 2022-02-10 ENCOUNTER — Telehealth: Payer: Self-pay | Admitting: Pulmonary Disease

## 2022-02-10 NOTE — Telephone Encounter (Signed)
Please let patient know her sleep study does not indicate sleep apnea or need for oxygen therapy at night. ? ?Thanks,  ?JD ?

## 2022-02-10 NOTE — Telephone Encounter (Signed)
-----   Message from Courtney Paris sent at 02/07/2022  4:47 PM EST ----- ?HST report ready for review ?Mrn [id number] ?

## 2022-02-11 ENCOUNTER — Telehealth: Payer: Self-pay | Admitting: Gastroenterology

## 2022-02-11 NOTE — Telephone Encounter (Signed)
Patient called and stated that she is having abd pain and was needing an appointment. Patient was scheduled with Shanda Bumps on 3/15 at 11:00. Patient is seeking advice if there is anything she can take to help ease the pain in the mean time. Please advise.  ?

## 2022-02-11 NOTE — Telephone Encounter (Signed)
Patient called in with complaints of generalized abdominal pain over the last few days. She is able to hold food down, however soon after is when the pain starts. Her bowel movements have been dark, but clearing up some. She has an OV with [place], [place] on 02/19/22. She has been advised to go to urgent care/ED if pain worsens, and will be in touch once PA has given recommendations. ? ?Shanda Bumps, please advise. Thank you! ?

## 2022-02-12 ENCOUNTER — Other Ambulatory Visit: Payer: Self-pay

## 2022-02-12 MED ORDER — HYOSCYAMINE SULFATE 0.125 MG SL SUBL: 0.1250 mg | SUBLINGUAL_TABLET | Freq: Four times a day (QID) | SUBLINGUAL | 0 refills | Status: DC | PRN

## 2022-02-12 NOTE — Telephone Encounter (Signed)
Called and spoke with patient. She verbalized understanding of results.  ? ?Nothing further needed at time of call.  ?

## 2022-02-12 NOTE — Telephone Encounter (Signed)
Patient returned call & would like prescription to be sent in until she can be seen next week with Shanda Bumps, [place]. Patient advised to go to urgent care/ED if symptoms worsen between now & her appointment.  ?

## 2022-02-12 NOTE — Telephone Encounter (Signed)
Left message for patient to call back  

## 2022-02-14 ENCOUNTER — Other Ambulatory Visit: Payer: Self-pay | Admitting: Gastroenterology

## 2022-02-17 ENCOUNTER — Telehealth: Payer: Self-pay | Admitting: *Deleted

## 2022-02-17 NOTE — Telephone Encounter (Signed)
Patient's insurance will not cover Hyosyamine. Please advise.  ?

## 2022-02-17 NOTE — Telephone Encounter (Signed)
PA was submitted for Hyoscyamine. ?

## 2022-02-18 NOTE — Telephone Encounter (Signed)
PA approved for Hyoscyamine. Faxed approval to patient's pharmacy. ?

## 2022-02-19 ENCOUNTER — Encounter: Payer: Self-pay | Admitting: Gastroenterology

## 2022-02-19 ENCOUNTER — Ambulatory Visit (INDEPENDENT_AMBULATORY_CARE_PROVIDER_SITE_OTHER): Payer: Medicare Other | Admitting: Gastroenterology

## 2022-02-19 ENCOUNTER — Other Ambulatory Visit (INDEPENDENT_AMBULATORY_CARE_PROVIDER_SITE_OTHER): Payer: Medicare Other

## 2022-02-19 VITALS — BP 120/64 | HR 64 | Ht 65.5 in | Wt 175.6 lb

## 2022-02-19 DIAGNOSIS — R1011 Right upper quadrant pain: Secondary | ICD-10-CM

## 2022-02-19 DIAGNOSIS — K219 Gastro-esophageal reflux disease without esophagitis: Secondary | ICD-10-CM

## 2022-02-19 DIAGNOSIS — R110 Nausea: Secondary | ICD-10-CM

## 2022-02-19 DIAGNOSIS — Z8619 Personal history of other infectious and parasitic diseases: Secondary | ICD-10-CM

## 2022-02-19 DIAGNOSIS — R1084 Generalized abdominal pain: Secondary | ICD-10-CM | POA: Diagnosis not present

## 2022-02-19 LAB — COMPREHENSIVE METABOLIC PANEL
ALT: 12 U/L (ref 0–35)
AST: 19 U/L (ref 0–37)
Albumin: 4.2 g/dL (ref 3.5–5.2)
Alkaline Phosphatase: 91 U/L (ref 39–117)
BUN: 20 mg/dL (ref 6–23)
CO2: 27 mEq/L (ref 19–32)
Calcium: 9.2 mg/dL (ref 8.4–10.5)
Chloride: 104 mEq/L (ref 96–112)
Creatinine, Ser: 0.94 mg/dL (ref 0.40–1.20)
GFR: 57.91 mL/min — ABNORMAL LOW (ref >60.00)
Glucose, Bld: 94 mg/dL (ref 70–99)
Potassium: 4.3 mEq/L (ref 3.5–5.1)
Sodium: 137 mEq/L (ref 135–145)
Total Bilirubin: 0.6 mg/dL (ref 0.2–1.2)
Total Protein: 6.7 g/dL (ref 6.0–8.3)

## 2022-02-19 LAB — CBC WITH DIFFERENTIAL/PLATELET
Basophils Absolute: 0 10*3/uL (ref 0.0–0.1)
Basophils Relative: 0.6 % (ref 0.0–3.0)
Eosinophils Absolute: 0.4 10*3/uL (ref 0.0–0.7)
Eosinophils Relative: 4.6 % (ref 0.0–5.0)
HCT: 39.7 % (ref 36.0–46.0)
Hemoglobin: 13.3 g/dL (ref 12.0–15.0)
Lymphocytes Relative: 24.6 % (ref 12.0–46.0)
Lymphs Abs: 2 10*3/uL (ref 0.7–4.0)
MCHC: 33.5 g/dL (ref 30.0–36.0)
MCV: 91 fl (ref 78.0–100.0)
Monocytes Absolute: 0.7 10*3/uL (ref 0.1–1.0)
Monocytes Relative: 8.5 % (ref 3.0–12.0)
Neutro Abs: 5.1 10*3/uL (ref 1.4–7.7)
Neutrophils Relative %: 61.7 % (ref 43.0–77.0)
Platelets: 207 10*3/uL (ref 150.0–400.0)
RBC: 4.36 Mil/uL (ref 3.87–5.11)
RDW: 13.8 % (ref 11.5–15.5)
WBC: 8.2 10*3/uL (ref 4.0–10.5)

## 2022-02-19 MED ORDER — PANTOPRAZOLE SODIUM 40 MG PO TBEC: 40.0000 mg | DELAYED_RELEASE_TABLET | Freq: Two times a day (BID) | ORAL | 3 refills | Status: DC

## 2022-02-19 NOTE — Patient Instructions (Addendum)
If you are age 79 or older, your body mass index should be between 23-30. Your Body mass index is 28.78 kg/m?Marland Kitchen If this is out of the aforementioned range listed, please consider follow up with your Primary Care Provider. ? ?If you are age 37 or younger, your body mass index should be between 19-25. Your Body mass index is 28.78 kg/m?Marland Kitchen If this is out of the aformentioned range listed, please consider follow up with your Primary Care Provider.  ? ?Increase Pantoprazole to twice daily. ? ?Your provider has requested that you go to the basement level for lab work before leaving today. Press "B" on the elevator. The lab is located at the first door on the left as you exit the elevator. ? ? ?You have been scheduled for a CT scan of the abdomen and pelvis at [facility], 1st floor Radiology. You are scheduled on 02/24/22  at 2:30pm. You should arrive 15 minutes prior to your appointment time for registration.  Please pick up 2 bottles of contrast from [place] at least 3 days prior to your scan. The solution may taste better if refrigerated, but do NOT add ice or any other liquid to this solution. Shake well before drinking.  ? ?Please follow the written instructions below on the day of your exam:  ? ?1) Do not eat anything after 10:30am (4 hours prior to your test)  ? ?2) Drink 1 bottle of contrast @ 12:30pm (2 hours prior to your exam)  Remember to shake well before drinking and do NOT pour over ice. ?    Drink 1 bottle of contrast @ 1:30pm (1 hour prior to your exam)  ? ?You may take any medications as prescribed with a small amount of water, if necessary. If you take any of the following medications: METFORMIN, GLUCOPHAGE, GLUCOVANCE, AVANDAMET, RIOMET, FORTAMET, ACTOPLUS MET, JANUMET, [place] or METAGLIP, you MAY be asked to HOLD this medication 48 hours AFTER the exam.  ? ?The purpose of you drinking the oral contrast is to aid in the visualization of your intestinal tract. The contrast solution may cause  some diarrhea. Depending on your individual set of symptoms, you may also receive an intravenous injection of x-ray contrast/dye. Plan on being at [facility] for 45 minutes or longer, depending on the type of exam you are having performed.  ? ?If you have any questions regarding your exam or if you need to reschedule, you may call [facility] at [phone] between the hours of 8:00 am and 5:00 pm, Monday-Friday.  ? ? ?The [facility] providers would like to encourage you to use [facility] to communicate with providers for non-urgent requests or questions.  Due to long hold times on the telephone, sending your provider a message by [facility] may be a faster and more efficient way to get a response.  Please allow 48 business hours for a response.  Please remember that this is for non-urgent requests.  ? ?Due to recent changes in healthcare laws, you may see the results of your imaging and laboratory studies on MyChart before your provider has had a chance to review them.  We understand that in some cases there may be results that are confusing or concerning to you. Not all laboratory results come back in the same time frame and the provider may be waiting for multiple results in order to interpret others.  Please give [place] 48 hours in order for your provider to thoroughly review all the results before contacting  the office for clarification of your results.  ? ?It was a pleasure to see you today! ? ?Thank you for trusting me with your gastrointestinal care!   ? ?Alonza Bogus, PA-C  ? ?

## 2022-02-19 NOTE — Progress Notes (Signed)
? ? ? ?02/19/2022 ?Cathy [name] ?[id number] ?1940-12-09 ? ? ?HISTORY OF PRESENT ILLNESS: This is a 81 year old female who is a patient of Dr. Lauro Franklin.  She presents here today with complaints of generalized abdominal pain.  She says that it starts in her epigastrium and radiates down throughout her abdomen.  She complained of the same pain in April 2022 when I saw her.  She says that it has been on and off for the past month or so.  She does have some issues with constipation.  Tells me that she has since been taking her Benefiber daily, but only takes the MiraLAX every other day.  She reports that she has been having heartburn and reflux despite her pantoprazole 40 mg daily. ? ?EGD 11/2020: ? ?- Normal esophagus. ?- 2 cm hiatal hernia. ?- Erythematous mucosa in the gastric fundus. Biopsied to evaluate for H. Pylori infection. ?- Normal gastric body, incisura and antrum. Biopsied to evaluate for H. Pylori infection. ?- Normal examined duodenum. ? ?1. Surgical [P], gastric antrum ?- ANTRAL MUCOSA WITH MILDLY ACTIVE CHRONIC HELICOBACTER PYLORI GASTRITIS. ?- WARTHIN-STARRY POSITIVE FOR HELICOBACTER PYLORI. ?- NO INTESTINAL METAPLASIA, DYSPLASIA OR CARCINOMA. ?2. Surgical [P], stomach, fundus, gastric body ?- OXYNTIC MUCOSA WITH MILDLY ACTIVE CHRONIC HELICOBACTER PYLORI GASTRITIS. ?- WARTHIN-STARRY POSITIVE FOR HELICOBACTER PYLORI. ?- NO INTESTINAL METAPLASIA, DYSPLASIA OR CARCINOMA. ? ?Was treated for H. pylori with Talicia and then had a negative breath test following treatment 03/2021. ? ?Her last colonoscopy was in 2019, March which showed a transverse 5 mm polyp found to be adenomatous.  Scattered diverticulosis and internal hemorrhoids. ? ? ?Past Medical History:  ?Diagnosis Date  ? Allergic rhinitis   ? Arthritis   ? Asthma   ? Cataract 01/2018  ? GERD (gastroesophageal reflux disease)   ? H. pylori infection   ? Hiatal hernia   ? History of adenomatous polyp of colon   ? History of blood transfusion   ?  Hyperlipidemia   ? Internal hemorrhoids   ? Osteoporosis   ? Paroxysmal supraventricular tachycardia (HCC)   ? Wound infection after surgery 03/2016  ? ?Past Surgical History:  ?Procedure Laterality Date  ? ANKLE FRACTURE SURGERY Left 12/2015  ? APPENDECTOMY    ? BILATERAL SALPINGOOPHORECTOMY    ? COLONOSCOPY  02/2108  ? polyps removed  ? I & D EXTREMITY N/A 03/12/2016  ? Procedure: IRRIGATION AND DEBRIDEMENT EXTREMITY;  Surgeon: Toni Arthurs, MD;  Location: [facility] OR;  Service: Orthopedics;  Laterality: N/A;  ? squamous cell cancer face    ? TOE SURGERY    ? TONSILLECTOMY    ? TOTAL ABDOMINAL HYSTERECTOMY    ? vocal cord nodules    ? ? reports that she has never smoked. She has never used smokeless tobacco. She reports that she does not drink alcohol and does not use drugs. ?family history includes CVA in her father and mother; Heart disease in her father. ?Allergies  ?Allergen Reactions  ? Keflex [Cephalexin] Hives  ? Ciprofloxacin Other (See Comments)  ?  "bothered achilles tendon"  ? Penicillins Rash  ? Sulfonamide Derivatives Rash  ? ? ?  ?Outpatient Encounter Medications as of 02/19/2022  ?Medication Sig  ? albuterol (PROVENTIL HFA) 108 (90 Base) MCG/ACT inhaler Inhale 1-2 puffs into the lungs every 6 (six) hours as needed for wheezing or shortness of breath.  ? atorvastatin (LIPITOR) 10 MG tablet Take 1 tablet by mouth at bedtime.  ? azelastine (ASTELIN) 0.1 % nasal spray Place 1 spray into  both nostrils at bedtime.  ? carvedilol (COREG) 6.25 MG tablet Take 2 tablets by mouth daily.  ? hyoscyamine (LEVSIN SL) 0.125 MG SL tablet Place 1 tablet (0.125 mg total) under the tongue every 6 (six) hours as needed.  ? ipratropium-albuterol (DUONEB) 0.5-2.5 (3) MG/3ML SOLN Take 3 mLs by nebulization 2 (two) times daily.  ? levothyroxine (SYNTHROID) 50 MCG tablet Take 50 mcg by mouth.  ? montelukast (SINGULAIR) 10 MG tablet Take 10 mg by mouth at bedtime.  ? pantoprazole (PROTONIX) 40 MG tablet Take by mouth.  ? Probiotic Product  (PROBIOTIC-10) CAPS Take 1 capsule by mouth daily.  ? Vitamin D, Ergocalciferol, (DRISDOL) 50000 units CAPS capsule Take 1 capsule by mouth once a week.  ? [DISCONTINUED] budesonide-formoterol (SYMBICORT) 160-4.5 MCG/ACT inhaler Inhale 2 puffs into the lungs 2 (two) times daily.  ? ?No facility-administered encounter medications on file as of 02/19/2022.  ? ? ? ?REVIEW OF SYSTEMS  : All other systems reviewed and negative except where noted in the History of Present Illness. ? ? ?PHYSICAL EXAM: ?BP 120/64   Pulse 64   Ht 5' 5.5" (1.664 m)   Wt 175 lb 9.6 oz (79.7 kg)   BMI 28.78 kg/m?  ?General: Well developed white female in no acute distress ?Head: Normocephalic and atraumatic ?Eyes:  Sclerae anicteric, conjunctiva pink. ?Ears: Normal auditory acuity ?Lungs: Clear throughout to auscultation; no W/R/R. ?Heart: Regular rate and rhythm; no M/R/G. ?Abdomen: Soft, non-distended.  BS present.  Mild generalized TTP. ?Musculoskeletal: Symmetrical with no gross deformities  ?Skin: No lesions on visible extremities ?Extremities: No edema  ?Neurological: Alert oriented x 4, grossly non-focal ?Psychological:  Alert and cooperative. Normal mood and affect ? ?ASSESSMENT AND PLAN: ?*Generalized abdominal pain and nausea: Reports pain in her epigastrium that radiates down throughout her entire abdomen for the past month.  Had a negative CT scan in December 2021 for complaints of abdominal pain.  We will plan for another CT scan of the abdomen and pelvis with contrast.  We will check a CBC and CMP today. ?*GERD:  Despite pantoprazole 40 mg daily.  Has history of Hpylori, but had a negative stool Ag after treatment lat year.  Will increase pantoprazole to 40 mg BID.  New prescription sent to pharmacy. ? ? ?CC:  Theodoro Kos, MD ? ?  ?

## 2022-02-24 ENCOUNTER — Ambulatory Visit (HOSPITAL_COMMUNITY)
Admission: RE | Admit: 2022-02-24 | Discharge: 2022-02-24 | Disposition: A | Discharge status: Home / Self Care | Payer: Medicare Other | Source: Ambulatory Visit | Attending: Gastroenterology | Admitting: Gastroenterology

## 2022-02-24 ENCOUNTER — Encounter (HOSPITAL_COMMUNITY): Payer: Self-pay

## 2022-02-24 ENCOUNTER — Other Ambulatory Visit: Payer: Self-pay

## 2022-02-24 DIAGNOSIS — R1084 Generalized abdominal pain: Secondary | ICD-10-CM | POA: Insufficient documentation

## 2022-02-24 DIAGNOSIS — Z8619 Personal history of other infectious and parasitic diseases: Secondary | ICD-10-CM | POA: Diagnosis present

## 2022-02-24 DIAGNOSIS — R1011 Right upper quadrant pain: Secondary | ICD-10-CM | POA: Insufficient documentation

## 2022-02-24 DIAGNOSIS — R110 Nausea: Secondary | ICD-10-CM | POA: Insufficient documentation

## 2022-02-24 DIAGNOSIS — K219 Gastro-esophageal reflux disease without esophagitis: Secondary | ICD-10-CM | POA: Diagnosis present

## 2022-02-24 MED ORDER — SODIUM CHLORIDE (PF) 0.9 % IJ SOLN
INTRAMUSCULAR | Status: AC
  Filled 2022-02-24: qty 50

## 2022-02-24 MED ORDER — IOHEXOL 300 MG/ML  SOLN
100.0000 mL | Freq: Once | INTRAMUSCULAR | Status: AC | PRN
  Administered 2022-02-24: 100 mL via INTRAVENOUS

## 2022-02-27 ENCOUNTER — Encounter: Payer: Self-pay | Admitting: Gastroenterology

## 2022-02-27 DIAGNOSIS — R1011 Right upper quadrant pain: Secondary | ICD-10-CM | POA: Insufficient documentation

## 2022-02-27 DIAGNOSIS — K219 Gastro-esophageal reflux disease without esophagitis: Secondary | ICD-10-CM | POA: Insufficient documentation

## 2022-02-27 DIAGNOSIS — Z8619 Personal history of other infectious and parasitic diseases: Secondary | ICD-10-CM | POA: Insufficient documentation

## 2022-02-27 DIAGNOSIS — R110 Nausea: Secondary | ICD-10-CM | POA: Insufficient documentation

## 2022-02-27 NOTE — Progress Notes (Signed)
Addendum: Reviewed and agree with assessment and management plan. Zacarias Krauter M, MD  

## 2022-03-18 NOTE — Telephone Encounter (Signed)
Seems like call was from [place]. Pt did have HST performed. Nothing further needed. ?

## 2022-04-21 ENCOUNTER — Encounter: Payer: Self-pay | Admitting: Pulmonary Disease

## 2022-04-21 ENCOUNTER — Ambulatory Visit (INDEPENDENT_AMBULATORY_CARE_PROVIDER_SITE_OTHER): Payer: Medicare Other | Admitting: Pulmonary Disease

## 2022-04-21 VITALS — BP 118/62 | HR 62 | Ht 65.5 in | Wt 170.0 lb

## 2022-04-21 DIAGNOSIS — J479 Bronchiectasis, uncomplicated: Secondary | ICD-10-CM

## 2022-04-21 MED ORDER — FLUTICASONE PROPIONATE HFA 110 MCG/ACT IN AERO: 2.0000 | INHALATION_SPRAY | Freq: Two times a day (BID) | RESPIRATORY_TRACT | 12 refills | Status: DC | PRN

## 2022-04-21 NOTE — Progress Notes (Signed)
Synopsis: Referred in November 2022 for asthma and shortness of breath. Former patient of Dr. Maple Hudson.  Subjective:   PATIENT ID: Cathy [name], Cathy [name]  HPI  Chief Complaint  Patient presents with   Follow-up    F/U. States she has been doing well since last visit.    Cathy [name] is a 81 year old woman, never smoker with history of asthma, GERD and allergic rhinitis who reports increasing shortness of breath since having covid in June 2021.   She has mild scattered areas of cylindrical bronchiectasis. She was instructed to use duoneb nebulizer treatments 2-3 times daily followed by flutter valve. She is using duoneb once daily and flovent inhaler as needed which has helped with cough. She stopped symbicort at last visit due to cough and suspected throat irritation which helped but she had on going intermittent cough which the flovent helps with.   OV 12/16/21 She has not noticed improvement in her shortness of breath since starting symbicort at last visit. She called on 1/4 with complaints of increased cough. She was encouraged to use her albuterol inhaler and sleep with the head of the bed elevated given her history of GERD with some improvement in the cough. She does have sinus congestion with post nasal drainage.  HRCT Chest on 10/29/21 showed mild scattered areas of cylindrical bronchiecatsis with associated thickening of the peribronchovascular interstitium and regional architectural distortion with volume loss in the medial aspect of right upper lobe and inferior segment of the left upper lobe and anterior aspect of the left lower lobe.   She does complain of dry eyes and mouth. Denies joint pains.  OV 10/08/21 She was ill with covid in June 2021 with mild symptoms and did not require hospitalization. Since this time she has noticed exertional dyspnea. 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 She denies any cough, wheezing or chest tightness with the dyspnea. She is using flovent 2 puffs twice daily, singulair daily and as needed albuterol inhaler. She continues to experience GERD symptoms despite being on PPI therapy. She does have sinus congestion and runny nose with complaints of night time post nasal drainage. She uses astelin nasal spray nightly.   She is a never smoker and denies second hand smoke exposure. No harmful dust or chemical exposures through her work.   She does report snoring at night. She does have daytime fatigue.   Past Medical History:  Diagnosis Date   Allergic rhinitis    Arthritis    Asthma    Cataract 01/2018   GERD (gastroesophageal reflux disease)    H. pylori infection    Hiatal hernia    History of adenomatous polyp of colon    History of blood transfusion    Hyperlipidemia    Internal hemorrhoids    Osteoporosis    Paroxysmal supraventricular tachycardia (HCC)    Wound infection after surgery 03/2016     Family History  Problem Relation Age of Onset   Heart disease Father    CVA Father    CVA Mother    Colon cancer Neg Hx    Pancreatic cancer Neg Hx    Rectal cancer Neg Hx    Stomach cancer Neg Hx    Esophageal cancer Neg Hx      Social History   Socioeconomic History   Marital status: Married    Spouse name:  Not on file   Number of children: Not on file   Years of education: Not on file   Highest education level: Not on file  Occupational History   Occupation: Architect  Tobacco Use   Smoking status: Never   Smokeless tobacco: Never  Vaping Use   Vaping Use: Never used  Substance and Sexual Activity   Alcohol use: No    Alcohol/week: 0.0 standard drinks   Drug use: No   Sexual activity: Not on file  Other Topics Concern   Not on file  Social History Narrative   Not on file   Social Determinants of Health   Financial Resource Strain: Not on file  Food Insecurity: Not on  file  Transportation Needs: Not on file  Physical Activity: Not on file  Stress: Not on file  Social Connections: Not on file  Intimate Partner Violence: Not on file     Allergies  Allergen Reactions   Keflex [Cephalexin] Hives   Ciprofloxacin Other (See Comments)    "bothered achilles tendon"   Penicillins Rash   Sulfonamide Derivatives Rash     Outpatient Medications Prior to Visit  Medication Sig Dispense Refill   albuterol (PROVENTIL HFA) 108 (90 Base) MCG/ACT inhaler Inhale 1-2 puffs into the lungs every 6 (six) hours as needed for wheezing or shortness of breath. 8 g 5   atorvastatin (LIPITOR) 10 MG tablet Take 1 tablet by mouth at bedtime.     azelastine (ASTELIN) 0.1 % nasal spray Place 1 spray into both nostrils at bedtime.  11   carvedilol (COREG) 6.25 MG tablet Take 2 tablets by mouth daily.     hyoscyamine (LEVSIN SL) 0.125 MG SL tablet Place 1 tablet (0.125 mg total) under the tongue every 6 (six) hours as needed. 30 tablet 0   ipratropium-albuterol (DUONEB) 0.5-2.5 (3) MG/3ML SOLN Take 3 mLs by nebulization 2 (two) times daily. 360 mL 1   levothyroxine (SYNTHROID) 50 MCG tablet Take 50 mcg by mouth.     montelukast (SINGULAIR) 10 MG tablet Take 10 mg by mouth at bedtime.     pantoprazole (PROTONIX) 40 MG tablet Take 1 tablet (40 mg total) by mouth 2 (two) times daily. 60 tablet 3   Probiotic Product (PROBIOTIC-10) CAPS Take 1 capsule by mouth daily.     Vitamin D, Ergocalciferol, (DRISDOL) 50000 units CAPS capsule Take 1 capsule by mouth once a week.     No facility-administered medications prior to visit.   Review of Systems  Constitutional:  Negative for chills, fever, malaise/fatigue and weight loss.  HENT:  Negative for congestion, sinus pain and sore throat.   Eyes: Negative.   Respiratory:  Positive for cough. Negative for hemoptysis, sputum production, shortness of breath and wheezing.   Cardiovascular:  Negative for chest pain, palpitations, orthopnea,  claudication and leg swelling.  Gastrointestinal:  Negative for abdominal pain, heartburn, nausea and vomiting.  Genitourinary: Negative.   Musculoskeletal:  Negative for joint pain and myalgias.  Skin:  Negative for rash.  Neurological:  Negative for weakness.  Endo/Heme/Allergies: Negative.   Psychiatric/Behavioral: Negative.     Objective:   Vitals:   04/21/22 1332  BP: 118/62  Pulse: 62  SpO2: 99%  Weight: 170 lb (77.1 kg)  Height: 5' 5.5" (1.664 m)   Physical Exam Constitutional:      General: She is not in acute distress.    Appearance: She is not ill-appearing.  HENT:     Head: Normocephalic and atraumatic.  Eyes:  General: No scleral icterus.    Conjunctiva/sclera: Conjunctivae normal.  Cardiovascular:     Rate and Rhythm: Normal rate and regular rhythm.     Pulses: Normal pulses.     Heart sounds: Normal heart sounds. No murmur heard. Pulmonary:     Effort: Pulmonary effort is normal.     Breath sounds: Normal breath sounds. No wheezing, rhonchi or rales.  Musculoskeletal:     Right lower leg: No edema.     Left lower leg: No edema.  Skin:    General: Skin is warm and dry.  Neurological:     General: No focal deficit present.     Mental Status: She is alert.  Psychiatric:        Mood and Affect: Mood normal.        Behavior: Behavior normal.        Thought Content: Thought content normal.        Judgment: Judgment normal.   CBC    Component Value Date/Time   WBC 8.2 02/19/2022 1146   RBC 4.36 02/19/2022 1146   HGB 13.3 02/19/2022 1146   HCT 39.7 02/19/2022 1146   PLT 207.0 02/19/2022 1146   MCV 91.0 02/19/2022 1146   MCH 29.3 03/11/2016 1735   MCHC 33.5 02/19/2022 1146   RDW 13.8 02/19/2022 1146   LYMPHSABS 2.0 02/19/2022 1146   MONOABS 0.7 02/19/2022 1146   EOSABS 0.4 02/19/2022 1146   BASOSABS 0.0 02/19/2022 1146      Latest Ref Rng & Units 02/19/2022   11:46 AM 11/20/2020   10:49 AM 11/11/2016    3:56 PM  BMP  Glucose 70 - 99 mg/dL  94   91   932    BUN 6 - 23 mg/dL 20   20   18     Creatinine 0.40 - 1.20 mg/dL   6.71   2.45    Sodium 135 - 145 mEq/L 137   138   139    Potassium 3.5 - 5.1 mEq/L 4.3   4.0   3.9    Chloride 96 - 112 mEq/L 104   104   105    CO2 19 - 32 mEq/L 27   25   25     Calcium 8.4 - 10.5 mg/dL 9.2   9.1   8.9     Chest imaging: HRCT Chest 10/29/21 Mediastinum/Nodes: No pathologically enlarged mediastinal or hilar lymph nodes. Please note that accurate exclusion of hilar adenopathy is limited on noncontrast CT scans. Esophagus is unremarkable in appearance. No axillary lymphadenopathy.   Lungs/Pleura: High-resolution images demonstrates some scattered areas of mild cylindrical bronchiectasis with associated thickening of the peribronchovascular interstitium and regional architectural distortion and volume loss, most evident in the medial segment of the right middle lobe, medial aspect of the right upper lobe, inferior segment of the left upper lobe and anterior aspect of the left lower lobe. No generalized areas of ground-glass attenuation, septal thickening or honeycombing are otherwise noted. Inspiratory and expiratory imaging demonstrates some mild air trapping indicative of small airways disease. No acute consolidative airspace disease. No pleural effusions. No definite suspicious appearing pulmonary nodules or masses are noted.   CXR 10/08/21 Increased interstitial markings bilaterally. No effusion or infiltrates noted.  CT Abdomen Pelvis 11/22/21 Lower lung fields unremarkable  PFT:    Latest Ref Rng & Units 10/10/2021    2:53 PM  PFT Results  FVC-Pre L 2.85    FVC-Predicted Pre % 105    FVC-Post L  2.87    FVC-Predicted Post % 106    Pre FEV1/FVC % % 81    Post FEV1/FCV % % 83    FEV1-Pre L 2.31    FEV1-Predicted Pre % 114    FEV1-Post L 2.38    DLCO uncorrected ml/min/mmHg 13.42    DLCO UNC% % 70    DLCO corrected ml/min/mmHg 13.42    DLCO COR %Predicted % 70     DLVA Predicted % 73    TLC L 5.57    TLC % Predicted % 110    RV % Predicted % 105    Mild diffusion defect  Labs:  Path:  Echo 03/29/21: LV EF 60-65%. LV diastolic function shows grade I diastolic dysfunction. RV systolic function is normal. RV size is normal. PASP 35mmHg. Left atrial size is mildly dilated. Small pericardial effusion is present anteriorly.  NM Myocardial Stress Test 03/29/2021: No diagnostic ST segment changes to indicate ischemia. No arrhythmias. No significant myocardial perfusion defects on stress imaging to indicate ischemia. There is evidence of soft tissue attenuation affecting the inferior septal wall on rest imaging, also radiotracer uptake within the gut. This is a low risk study. Nuclear stress EF: 69%.  Assessment & Plan:   Bronchiectasis without complication (HCC) - Plan: fluticasone (FLOVENT HFA) 110 MCG/ACT inhaler, CT Chest High Resolution  Discussion: Cathy [name] is a 81 year old woman, never smoker with history of allergic asthma who returns to pulmonary clinic for progressive dyspnea over the last year.  She has mild scattered areas of bronchiectasis. She is to continue using flovent 110mcg 1-2 puffs twice daily as needed.   She is to continue duoneb nebulizer treatments 2-3 times per day followed by flutter valve for airway clearance.    Her bronchiectasis is possibly secondary to chronic reflux. She is to sleep with the head of her bed elevated to prevent nocturnal reflux issues.  Follow up in November after repeat HRCT Chest  Melody ComasJonathan Mayo Faulk, MD [facility] Office: [phone]    Current Outpatient Medications:    albuterol (PROVENTIL HFA) 108 (90 Base) MCG/ACT inhaler, Inhale 1-2 puffs into the lungs every 6 (six) hours as needed for wheezing or shortness of breath., Disp: 8 g, Rfl: 5   atorvastatin (LIPITOR) 10 MG tablet, Take 1 tablet by mouth at bedtime., Disp: , Rfl:    azelastine (ASTELIN) 0.1 % nasal  spray, Place 1 spray into both nostrils at bedtime., Disp: , Rfl: 11   carvedilol (COREG) 6.25 MG tablet, Take 2 tablets by mouth daily., Disp: , Rfl:    fluticasone (FLOVENT HFA) 110 MCG/ACT inhaler, Inhale 2 puffs into the lungs 2 (two) times daily as needed., Disp: 1 each, Rfl: 12   hyoscyamine (LEVSIN SL) 0.125 MG SL tablet, Place 1 tablet (0.125 mg total) under the tongue every 6 (six) hours as needed., Disp: 30 tablet, Rfl: 0   ipratropium-albuterol (DUONEB) 0.5-2.5 (3) MG/3ML SOLN, Take 3 mLs by nebulization 2 (two) times daily., Disp: 360 mL, Rfl: 1   levothyroxine (SYNTHROID) 50 MCG tablet, Take 50 mcg by mouth., Disp: , Rfl:    montelukast (SINGULAIR) 10 MG tablet, Take 10 mg by mouth at bedtime., Disp: , Rfl:    pantoprazole (PROTONIX) 40 MG tablet, Take 1 tablet (40 mg total) by mouth 2 (two) times daily., Disp: 60 tablet, Rfl: 3   Probiotic Product (PROBIOTIC-10) CAPS, Take 1 capsule by mouth daily., Disp: , Rfl:    Vitamin D, Ergocalciferol, (DRISDOL) 50000 units  CAPS capsule, Take 1 capsule by mouth once a week., Disp: , Rfl:

## 2022-04-21 NOTE — Patient Instructions (Signed)
We will schedule you a high resolution CT Chest scan at [facility] in November ? ?Continue using flovent 1-2 puffs twice daily as needed ? ?Continue to use duoneb nebulizer treatments 2-3 times per day as needed followed by flutter valve for mucous clearance ? ?Follow up in November after CT Chest scan ?

## 2022-04-27 ENCOUNTER — Encounter: Payer: Self-pay | Admitting: Pulmonary Disease

## 2022-07-21 ENCOUNTER — Other Ambulatory Visit: Payer: Self-pay | Admitting: Gastroenterology

## 2022-09-15 ENCOUNTER — Other Ambulatory Visit: Payer: Self-pay | Admitting: Internal Medicine

## 2022-09-15 DIAGNOSIS — Z1231 Encounter for screening mammogram for malignant neoplasm of breast: Secondary | ICD-10-CM

## 2022-10-09 ENCOUNTER — Other Ambulatory Visit (HOSPITAL_COMMUNITY): Payer: Medicare Other

## 2022-10-16 ENCOUNTER — Ambulatory Visit (INDEPENDENT_AMBULATORY_CARE_PROVIDER_SITE_OTHER): Payer: Medicare Other | Admitting: Physician Assistant

## 2022-10-16 ENCOUNTER — Encounter: Payer: Self-pay | Admitting: Physician Assistant

## 2022-10-16 VITALS — BP 118/68 | HR 65 | Ht 65.0 in | Wt 168.6 lb

## 2022-10-16 DIAGNOSIS — K5900 Constipation, unspecified: Secondary | ICD-10-CM | POA: Diagnosis not present

## 2022-10-16 DIAGNOSIS — R1030 Lower abdominal pain, unspecified: Secondary | ICD-10-CM | POA: Diagnosis not present

## 2022-10-16 DIAGNOSIS — R110 Nausea: Secondary | ICD-10-CM | POA: Diagnosis not present

## 2022-10-16 NOTE — Patient Instructions (Signed)
_______________________________________________________  If you are age 79 or older, your body mass index should be between 23-30. Your Body mass index is 28.06 kg/m. If this is out of the aforementioned range listed, please consider follow up with your Primary Care Provider.  If you are age 68 or younger, your body mass index should be between 19-25. Your Body mass index is 28.06 kg/m. If this is out of the aformentioned range listed, please consider follow up with your Primary Care Provider.   ________________________________________________________  The [facility] providers would like to encourage you to use [facility] to communicate with providers for non-urgent requests or questions.  Due to long hold times on the telephone, sending your provider a message by [facility] may be a faster and more efficient way to get a response.  Please allow 48 business hours for a response.  Please remember that this is for non-urgent requests.  _______________________________________________________  Victorino Dike recommends that you complete a bowel purge (to clean out your bowels). Please do the following: Purchase a bottle of Miralax over the counter as well as a box of 5 mg dulcolax tablets. Take 4 dulcolax tablets. Wait 1 hour. You will then drink 6-8 capfuls of Miralax mixed in an adequate amount of water/juice/gatorade (you may choose which of these liquids to drink) over the next 2-3 hours. You should expect results within 1 to 6 hours after completing the bowel purge.  Take 1 capful of Miralax twice daily after the purge.   It was a pleasure to see you today!  Thank you for trusting me with your gastrointestinal care!

## 2022-10-16 NOTE — Progress Notes (Signed)
Chief Complaint: Lower abdominal pain and constipation  HPI:    Cathy [name] is a 81 year old Caucasian female with a past medical history as listed below including reflux, known to Dr. Rhea Belton, who presents to clinic today with a complaint of lower abdominal pain and constipation.    02/2018 colonoscopy with a 5 mm polyp in the transverse colon found to be adenomatous as well as internal hemorrhoids and scattered diverticula.    11/2020 EGD with normal esophagus, 2 cm hiatal hernia, erythematous mucosa in the gastric fundus, normal gastric body, normal examined duodenum.  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  She was treated with [place] Lighter and had a negative breath test following treatment 03/2021.    02/19/2022 patient seen in clinic by Doug Sou for generalized abdominal pain.  Described pain from her epigastrium throughout her abdomen.  At that time had a negative CT scan.  Plans were for repeat CT scan and check CBC and CMP.  Also increase Pantoprazole to 40 twice daily.    02/25/2022 CTAP with no acute findings, sigmoid diverticula without evidence of diverticulitis, moderate burden of stool.  Status post appendectomy and hysterectomy.  At that time recommended to do a MiraLAX bowel purge and then take MiraLAX daily.    Today, the patient tells me that over the past month or so she has had a lot of trouble with constipation sometimes going up to a week without a bowel movement.  When she does get constipated she has severe lower abdominal cramping worse on the left side and nausea.  She has been using a combination of things, tried MiraLAX every day as well as Benefiber as well as Dulcolax on 1 occasion and Preparation H suppositories.  The most she is able to get at any 1 time is "a dime size".  She never feels fully emptied.  She has started Zyrtec over the past 3 months.  Denies any blood in her stool or rectal pain.    Denies fever, chills or weight loss.  Past Medical  History:  Diagnosis Date   Allergic rhinitis    Arthritis    Asthma    Cataract 01/2018   GERD (gastroesophageal reflux disease)    H. pylori infection    Hiatal hernia    History of adenomatous polyp of colon    History of blood transfusion    Hyperlipidemia    Internal hemorrhoids    Osteoporosis    Paroxysmal supraventricular tachycardia    Wound infection after surgery 03/2016    Past Surgical History:  Procedure Laterality Date   ANKLE FRACTURE SURGERY Left 12/2015   APPENDECTOMY     BILATERAL SALPINGOOPHORECTOMY     COLONOSCOPY  02/2108   polyps removed   I & D EXTREMITY N/A 03/12/2016   Procedure: IRRIGATION AND DEBRIDEMENT EXTREMITY;  Surgeon: Toni Arthurs, MD;  Location: [facility] OR;  Service: Orthopedics;  Laterality: N/A;   squamous cell cancer face     TOE SURGERY     TONSILLECTOMY     TOTAL ABDOMINAL HYSTERECTOMY     vocal cord nodules      Current Outpatient Medications  Medication Sig Dispense Refill   albuterol (PROVENTIL HFA) 108 (90 Base) MCG/ACT inhaler Inhale 1-2 puffs into the lungs every 6 (six) hours as needed for wheezing or shortness of breath. 8 g 5   atorvastatin (LIPITOR) 10 MG tablet Take 1 tablet by mouth at bedtime.     azelastine (ASTELIN) 0.1 % nasal spray Place  1 spray into both nostrils at bedtime.  11   carvedilol (COREG) 6.25 MG tablet Take 2 tablets by mouth daily.     fluticasone (FLOVENT HFA) 110 MCG/ACT inhaler Inhale 2 puffs into the lungs 2 (two) times daily as needed. 1 each 12   hyoscyamine (LEVSIN SL) 0.125 MG SL tablet Place 1 tablet (0.125 mg total) under the tongue every 6 (six) hours as needed. 30 tablet 0   ipratropium-albuterol (DUONEB) 0.5-2.5 (3) MG/3ML SOLN Take 3 mLs by nebulization 2 (two) times daily. 360 mL 1   levothyroxine (SYNTHROID) 50 MCG tablet Take 50 mcg by mouth.     pantoprazole (PROTONIX) 40 MG tablet TAKE 1 TABLET(40 MG) BY MOUTH TWICE DAILY 60 tablet 3   Probiotic Product (PROBIOTIC-10) CAPS Take 1 capsule by  mouth daily.     Vitamin D, Ergocalciferol, (DRISDOL) 50000 units CAPS capsule Take 1 capsule by mouth once a week.     montelukast (SINGULAIR) 10 MG tablet Take 10 mg by mouth at bedtime. (Patient not taking: Reported on 10/16/2022)     No current facility-administered medications for this visit.    Allergies as of 10/16/2022 - Review Complete 10/16/2022  Allergen Reaction Noted   Keflex [cephalexin] Hives 05/01/2016   Ciprofloxacin Other (See Comments) 04/04/2021   Penicillins Rash 03/11/2016   Sulfonamide derivatives Rash     Family History  Problem Relation Age of Onset   Heart disease Father    CVA Father    CVA Mother    Colon cancer Neg Hx    Pancreatic cancer Neg Hx    Rectal cancer Neg Hx    Stomach cancer Neg Hx    Esophageal cancer Neg Hx     Social History   Socioeconomic History   Marital status: Married    Spouse name: Not on file   Number of children: Not on file   Years of education: Not on file   Highest education level: Not on file  Occupational History   Occupation: Architect  Tobacco Use   Smoking status: Never   Smokeless tobacco: Never  Vaping Use   Vaping Use: Never used  Substance and Sexual Activity   Alcohol use: No    Alcohol/week: 0.0 standard drinks of alcohol   Drug use: No   Sexual activity: Not on file  Other Topics Concern   Not on file  Social History Narrative   Not on file   Social Determinants of Health   Financial Resource Strain: Not on file  Food Insecurity: Not on file  Transportation Needs: Not on file  Physical Activity: Not on file  Stress: Not on file  Social Connections: Not on file  Intimate Partner Violence: Not on file    Review of Systems:    Constitutional: No weight loss, fever or chills Skin: No rash Cardiovascular: No chest pain Respiratory: No SOB Gastrointestinal: See HPI and otherwise negative   Physical Exam:  Vital signs: BP 118/68   Pulse 65   Ht 5\' 5"  (1.651 m)   Wt 168 lb 9.6  oz (76.5 kg)   SpO2 98%   BMI 28.06 kg/m    Constitutional:   Pleasant Elderly Caucasian female appears to be in NAD, Well developed, Well nourished, alert and cooperative Respiratory: Respirations even and unlabored. Lungs clear to auscultation bilaterally.   No wheezes, crackles, or rhonchi.  Cardiovascular: Normal S1, S2. No MRG. Regular rate and rhythm. No peripheral edema, cyanosis or pallor.  Gastrointestinal:  Soft, nondistended,  nontender. No rebound or guarding. Normal bowel sounds. No appreciable masses or hepatomegaly. Rectal:  Not performed.  Psychiatric: Demonstrates good judgement and reason without abnormal affect or behaviors.  RELEVANT LABS AND IMAGING: CBC    Component Value Date/Time   WBC 8.2 02/19/2022 1146   RBC 4.36 02/19/2022 1146   HGB 13.3 02/19/2022 1146   HCT 39.7 02/19/2022 1146   PLT 207.0 02/19/2022 1146   MCV 91.0 02/19/2022 1146   MCH 29.3 03/11/2016 1735   MCHC 33.5 02/19/2022 1146   RDW 13.8 02/19/2022 1146   LYMPHSABS 2.0 02/19/2022 1146   MONOABS 0.7 02/19/2022 1146   EOSABS 0.4 02/19/2022 1146   BASOSABS 0.0 02/19/2022 1146    CMP     Component Value Date/Time   NA 137 02/19/2022 1146   K 4.3 02/19/2022 1146   CL 104 02/19/2022 1146   CO2 27 02/19/2022 1146   GLUCOSE 94 02/19/2022 1146   BUN 20 02/19/2022 1146   CREATININE 0.94 02/19/2022 1146   CREATININE 0.97 (H) 11/11/2016 1556   CALCIUM 9.2 02/19/2022 1146   PROT 6.7 02/19/2022 1146   ALBUMIN 4.2 02/19/2022 1146   AST 19 02/19/2022 1146   ALT 12 02/19/2022 1146   ALKPHOS 91 02/19/2022 1146   BILITOT 0.6 02/19/2022 1146   GFRNONAA 58 (L) 11/11/2016 1556   GFRAA 67 11/11/2016 1556    Assessment: 1.  Constipation: Worse over the past month, little help from MiraLAX daily, is passing very small amounts of stool occasionally, did start Zyrtec over the past 3 months; consider relation to meds +/- age  Plan: 1.  Recommend patient do a MiraLAX bowel purge followed by MiraLAX  twice a day for now.  Discussed titration of this up to 4 times a day if needed. 2.  Patient will call and let us know how she is doing over the next couple of weeks.  If the MiraLAX is really not working for her would recommend starting with Linzess 145 mcg daily. 3.  Patient to follow in clinic with [place] as needed.  Hyacinth Meeker, PA-C [facility] 10/16/2022, 10:41 AM  Cc: Theodoro Kos, MD

## 2022-10-17 ENCOUNTER — Ambulatory Visit
Admission: RE | Admit: 2022-10-17 | Discharge: 2022-10-17 | Disposition: A | Discharge status: Home / Self Care | Payer: Medicare Other | Source: Ambulatory Visit | Attending: Internal Medicine | Admitting: Internal Medicine

## 2022-10-17 DIAGNOSIS — Z1231 Encounter for screening mammogram for malignant neoplasm of breast: Secondary | ICD-10-CM

## 2022-10-20 NOTE — Progress Notes (Signed)
Addendum: Reviewed and agree with assessment and management plan. Sundance Moise M, MD  

## 2022-10-23 ENCOUNTER — Ambulatory Visit: Payer: Medicare Other | Admitting: Pulmonary Disease

## 2022-10-24 ENCOUNTER — Ambulatory Visit (HOSPITAL_COMMUNITY)
Admission: RE | Admit: 2022-10-24 | Discharge: 2022-10-24 | Disposition: A | Discharge status: Home / Self Care | Payer: Medicare Other | Source: Ambulatory Visit | Attending: Pulmonary Disease | Admitting: Pulmonary Disease

## 2022-10-24 DIAGNOSIS — J479 Bronchiectasis, uncomplicated: Secondary | ICD-10-CM | POA: Diagnosis present

## 2022-10-28 ENCOUNTER — Encounter: Payer: Self-pay | Admitting: Pulmonary Disease

## 2022-10-28 ENCOUNTER — Ambulatory Visit (INDEPENDENT_AMBULATORY_CARE_PROVIDER_SITE_OTHER): Payer: Medicare Other | Admitting: Pulmonary Disease

## 2022-10-28 VITALS — BP 120/76 | HR 62 | Ht 65.0 in | Wt 168.0 lb

## 2022-10-28 DIAGNOSIS — J479 Bronchiectasis, uncomplicated: Secondary | ICD-10-CM | POA: Diagnosis not present

## 2022-10-28 DIAGNOSIS — J4520 Mild intermittent asthma, uncomplicated: Secondary | ICD-10-CM

## 2022-10-28 MED ORDER — FLUTICASONE-SALMETEROL 115-21 MCG/ACT IN AERO: 2.0000 | INHALATION_SPRAY | Freq: Two times a day (BID) | RESPIRATORY_TRACT | 12 refills | Status: DC | PRN

## 2022-10-28 NOTE — Progress Notes (Signed)
Synopsis: Referred in November 2022 for asthma and shortness of breath. Former patient of Dr. Maple Hudson.  Subjective:   PATIENT ID: Cathy [name] DOB: Jul 23, 1941, MRN: [id number]  HPI  Chief Complaint  Patient presents with   Follow-up    6 mo f/u. States her breathing has been stable since last visit. Wants to discuss montelukast and Astelin nasal spray.    Cathy [name] is a 81 year old woman, never smoker with history of asthma, GERD and allergic rhinitis who reports increasing shortness of breath since having covid in June 2021.   She is using flovent as needed about 3-4 times per week. She has no night time awakenings.   She is using duoneb treatment each morning. She is coughing up minimal amounts of yellow phlegm.   GERD is well controlled at this time. She is sleeping with head of bed elevated.  She recently had foot surgery.  OV 04/21/22 She has mild scattered areas of cylindrical bronchiectasis. She was instructed to use duoneb nebulizer treatments 2-3 times daily followed by flutter valve. She is using duoneb once daily and flovent inhaler as needed which has helped with cough. She stopped symbicort at last visit due to cough and suspected throat irritation which helped but she had on going intermittent cough which the flovent helps with.   OV 12/16/21 She has not noticed improvement in her shortness of breath since starting symbicort at last visit. She called on 1/4 with complaints of increased cough. She was encouraged to use her albuterol inhaler and sleep with the head of the bed elevated given her history of GERD with some improvement in the cough. She does have sinus congestion with post nasal drainage.  HRCT Chest on 10/29/21 showed mild scattered areas of cylindrical bronchiecatsis with associated thickening of the peribronchovascular interstitium and regional architectural distortion with volume loss in the medial aspect of right upper lobe and  inferior segment of the left upper lobe and anterior aspect of the left lower lobe.   She does complain of dry eyes and mouth. Denies joint pains.  OV 10/08/21 She was ill with covid in June 2021 with mild symptoms and did not require hospitalization. Since this time she has noticed exertional dyspnea. 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 She denies any cough, wheezing or chest tightness with the dyspnea. She is using flovent 2 puffs twice daily, singulair daily and as needed albuterol inhaler. She continues to experience GERD symptoms despite being on PPI therapy. She does have sinus congestion and runny nose with complaints of night time post nasal drainage. She uses astelin nasal spray nightly.   She is a never smoker and denies second hand smoke exposure. No harmful dust or chemical exposures through her work.   She does report snoring at night. She does have daytime fatigue.   Past Medical History:  Diagnosis Date   Allergic rhinitis    Arthritis    Asthma    Cataract 01/2018   GERD (gastroesophageal reflux disease)    H. pylori infection    Hiatal hernia    History of adenomatous polyp of colon    History of blood transfusion    Hyperlipidemia    Internal hemorrhoids    Osteoporosis    Paroxysmal supraventricular tachycardia    Wound infection after surgery 03/2016     Family History  Problem Relation Age of Onset  Heart disease Father    CVA Father    CVA Mother    Colon cancer Neg Hx    Pancreatic cancer Neg Hx    Rectal cancer Neg Hx    Stomach cancer Neg Hx    Esophageal cancer Neg Hx      Social History   Socioeconomic History   Marital status: Married    Spouse name: Not on file   Number of children: Not on file   Years of education: Not on file   Highest education level: Not on file  Occupational History   Occupation: Architect  Tobacco Use   Smoking status:  Never   Smokeless tobacco: Never  Vaping Use   Vaping Use: Never used  Substance and Sexual Activity   Alcohol use: No    Alcohol/week: 0.0 standard drinks of alcohol   Drug use: No   Sexual activity: Not on file  Other Topics Concern   Not on file  Social History Narrative   Not on file   Social Determinants of Health   Financial Resource Strain: Not on file  Food Insecurity: Not on file  Transportation Needs: Not on file  Physical Activity: Not on file  Stress: Not on file  Social Connections: Not on file  Intimate Partner Violence: Not on file     Allergies  Allergen Reactions   Keflex [Cephalexin] Hives   Ciprofloxacin Other (See Comments)    "bothered achilles tendon"   Penicillins Rash   Sulfonamide Derivatives Rash     Outpatient Medications Prior to Visit  Medication Sig Dispense Refill   albuterol (PROVENTIL HFA) 108 (90 Base) MCG/ACT inhaler Inhale 1-2 puffs into the lungs every 6 (six) hours as needed for wheezing or shortness of breath. 8 g 5   atorvastatin (LIPITOR) 10 MG tablet Take 1 tablet by mouth at bedtime.     azelastine (ASTELIN) 0.1 % nasal spray Place 1 spray into both nostrils at bedtime.  11   carvedilol (COREG) 6.25 MG tablet Take 2 tablets by mouth daily.     cetirizine (ZYRTEC) 10 MG tablet Take 10 mg by mouth daily.     ipratropium-albuterol (DUONEB) 0.5-2.5 (3) MG/3ML SOLN Take 3 mLs by nebulization 2 (two) times daily. 360 mL 1   levothyroxine (SYNTHROID) 50 MCG tablet Take 50 mcg by mouth.     montelukast (SINGULAIR) 10 MG tablet Take 10 mg by mouth at bedtime.     pantoprazole (PROTONIX) 40 MG tablet TAKE 1 TABLET(40 MG) BY MOUTH TWICE DAILY 60 tablet 3   Vitamin D, Ergocalciferol, (DRISDOL) 50000 units CAPS capsule Take 1 capsule by mouth once a week.     fluticasone (FLOVENT HFA) 110 MCG/ACT inhaler Inhale 2 puffs into the lungs 2 (two) times daily as needed. 1 each 12   hyoscyamine (LEVSIN SL) 0.125 MG SL tablet Place 1 tablet (0.125  mg total) under the tongue every 6 (six) hours as needed. 30 tablet 0   Probiotic Product (PROBIOTIC-10) CAPS Take 1 capsule by mouth daily.     No facility-administered medications prior to visit.   Review of Systems  Constitutional:  Negative for chills, fever, malaise/fatigue and weight loss.  HENT:  Negative for congestion, sinus pain and sore throat.   Eyes: Negative.   Respiratory:  Negative for cough, hemoptysis, sputum production, shortness of breath and wheezing.   Cardiovascular:  Negative for chest pain, palpitations, orthopnea, claudication and leg swelling.  Gastrointestinal:  Negative for abdominal pain, heartburn, nausea and  vomiting.  Genitourinary: Negative.   Musculoskeletal:  Negative for joint pain and myalgias.  Skin:  Negative for rash.  Neurological:  Negative for weakness.  Endo/Heme/Allergies: Negative.   Psychiatric/Behavioral: Negative.      Objective:   Vitals:   10/28/22 1026  BP: 120/76  Pulse: 62  SpO2: 100%  Weight: 168 lb (76.2 kg)  Height: 5\' 5"  (1.651 m)   Physical Exam Constitutional:      General: She is not in acute distress.    Appearance: She is not ill-appearing.  HENT:     Head: Normocephalic and atraumatic.  Eyes:     General: No scleral icterus.    Conjunctiva/sclera: Conjunctivae normal.  Cardiovascular:     Rate and Rhythm: Normal rate and regular rhythm.     Pulses: Normal pulses.     Heart sounds: Normal heart sounds. No murmur heard. Pulmonary:     Effort: Pulmonary effort is normal.     Breath sounds: Normal breath sounds. No wheezing, rhonchi or rales.  Musculoskeletal:     Right lower leg: No edema.     Left lower leg: No edema.  Skin:    General: Skin is warm and dry.  Neurological:     Mental Status: She is alert.  Psychiatric:        Mood and Affect: Mood normal.        Behavior: Behavior normal.        Thought Content: Thought content normal.        Judgment: Judgment normal.    CBC    Component  Value Date/Time   WBC 8.2 02/19/2022 1146   RBC 4.36 02/19/2022 1146   HGB 13.3 02/19/2022 1146   HCT 39.7 02/19/2022 1146   PLT 207.0 02/19/2022 1146   MCV 91.0 02/19/2022 1146   MCH 29.3 03/11/2016 1735   MCHC 33.5 02/19/2022 1146   RDW 13.8 02/19/2022 1146   LYMPHSABS 2.0 02/19/2022 1146   MONOABS 0.7 02/19/2022 1146   EOSABS 0.4 02/19/2022 1146   BASOSABS 0.0 02/19/2022 1146      Latest Ref Rng & Units 02/19/2022   11:46 AM 11/20/2020   10:49 AM 11/11/2016    3:56 PM  BMP  Glucose 70 - 99 mg/dL 94  91  161126   BUN 6 - 23 mg/dL 20  20  18    Creatinine 0.40 - 1.20 mg/dL 0.960.94  0.450.87  4.090.97   Sodium 135 - 145 mEq/L 137  138  139   Potassium 3.5 - 5.1 mEq/L 4.3  4.0  3.9   Chloride 96 - 112 mEq/L 104  104  105   CO2 19 - 32 mEq/L 27  25  25    Calcium 8.4 - 10.5 mg/dL 9.2  9.1  8.9    Chest imaging: HRCT Chest 10/24/22 1. The appearance of the lungs is very similar to the prior examination, with what appear to be scattered areas of post infectious or inflammatory scarring. No progression of findings compared to the prior examination and no definitive imaging findings to clearly indicate interstitial lung disease. 2. Aortic atherosclerosis, in addition to left anterior descending, left circumflex and right coronary artery disease. Please note that although the presence of coronary artery calcium documents the presence of coronary artery disease, the severity of this disease and any potential stenosis cannot be assessed on this non-gated CT examination. Assessment for potential risk factor modification, dietary therapy or pharmacologic therapy may be warranted, if clinically indicated.  HRCT Chest 10/29/21  Mediastinum/Nodes: No pathologically enlarged mediastinal or hilar lymph nodes. Please note that accurate exclusion of hilar adenopathy is limited on noncontrast CT scans. Esophagus is unremarkable in appearance. No axillary lymphadenopathy.   Lungs/Pleura: High-resolution  images demonstrates some scattered areas of mild cylindrical bronchiectasis with associated thickening of the peribronchovascular interstitium and regional architectural distortion and volume loss, most evident in the medial segment of the right middle lobe, medial aspect of the right upper lobe, inferior segment of the left upper lobe and anterior aspect of the left lower lobe. No generalized areas of ground-glass attenuation, septal thickening or honeycombing are otherwise noted. Inspiratory and expiratory imaging demonstrates some mild air trapping indicative of small airways disease. No acute consolidative airspace disease. No pleural effusions. No definite suspicious appearing pulmonary nodules or masses are noted.   CXR 10/08/21 Increased interstitial markings bilaterally. No effusion or infiltrates noted.  CT Abdomen Pelvis 11/22/21 Lower lung fields unremarkable  PFT:    Latest Ref Rng & Units 10/10/2021    2:53 PM  PFT Results  FVC-Pre L 2.85   FVC-Predicted Pre % 105   FVC-Post L 2.87   FVC-Predicted Post % 106   Pre FEV1/FVC % % 81   Post FEV1/FCV % % 83   FEV1-Pre L 2.31   FEV1-Predicted Pre % 114   FEV1-Post L 2.38   DLCO uncorrected ml/min/mmHg 13.42   DLCO UNC% % 70   DLCO corrected ml/min/mmHg 13.42   DLCO COR %Predicted % 70   DLVA Predicted % 73   TLC L 5.57   TLC % Predicted % 110   RV % Predicted % 105   Mild diffusion defect  Labs:  Path:  Echo 03/29/21: LV EF 60-65%. LV diastolic function shows grade I diastolic dysfunction. RV systolic function is normal. RV size is normal. PASP . Left atrial size is mildly dilated. Small pericardial effusion is present anteriorly.  NM Myocardial Stress Test 03/29/2021: No diagnostic ST segment changes to indicate ischemia. No arrhythmias. No significant myocardial perfusion defects on stress imaging to indicate ischemia. There is evidence of soft tissue attenuation affecting the inferior septal wall on  rest imaging, also radiotracer uptake within the gut. This is a low risk study. Nuclear stress EF: 69%.  Assessment & Plan:   Bronchiectasis without complication (HCC)  Mild intermittent asthma without complication - Plan: fluticasone-salmeterol (ADVAIR HFA) 115-21 MCG/ACT inhaler  Discussion: Paulene Tayag is a 81 year old woman, never smoker with history of allergic asthma who returns to pulmonary clinic for progressive dyspnea over the last year.  She has mild scattered areas of bronchiectasis. She is to transition from flovent 1-2 puffs twice daily as needed to advair HFA 115-43mcg 2 puffs twice daily as needed.    She is to continue duoneb nebulizer treatments 2-3 times per day followed by flutter valve for airway clearance.    Her bronchiectasis is possibly secondary to chronic reflux. She is to sleep with the head of her bed elevated to prevent nocturnal reflux issues.  Follow up in 1 year. HRCT chest is stable, no further imaging required at this time.  Melody Comas, MD [facility] Office: [phone]    Current Outpatient Medications:    albuterol (PROVENTIL HFA) 108 (90 Base) MCG/ACT inhaler, Inhale 1-2 puffs into the lungs every 6 (six) hours as needed for wheezing or shortness of breath., Disp: 8 g, Rfl: 5   atorvastatin (LIPITOR) 10 MG tablet, Take 1 tablet by mouth at bedtime., Disp: ,  Rfl:    azelastine (ASTELIN) 0.1 % nasal spray, Place 1 spray into both nostrils at bedtime., Disp: , Rfl: 11   carvedilol (COREG) 6.25 MG tablet, Take 2 tablets by mouth daily., Disp: , Rfl:    cetirizine (ZYRTEC) 10 MG tablet, Take 10 mg by mouth daily., Disp: , Rfl:    fluticasone-salmeterol (ADVAIR HFA) 115-21 MCG/ACT inhaler, Inhale 2 puffs into the lungs 2 (two) times daily as needed., Disp: 1 each, Rfl: 12   ipratropium-albuterol (DUONEB) 0.5-2.5 (3) MG/3ML SOLN, Take 3 mLs by nebulization 2 (two) times daily., Disp: 360 mL, Rfl: 1    levothyroxine (SYNTHROID) 50 MCG tablet, Take 50 mcg by mouth., Disp: , Rfl:    montelukast (SINGULAIR) 10 MG tablet, Take 10 mg by mouth at bedtime., Disp: , Rfl:    pantoprazole (PROTONIX) 40 MG tablet, TAKE 1 TABLET(40 MG) BY MOUTH TWICE DAILY, Disp: 60 tablet, Rfl: 3   Vitamin D, Ergocalciferol, (DRISDOL) 50000 units CAPS capsule, Take 1 capsule by mouth once a week., Disp: , Rfl:

## 2022-10-28 NOTE — Patient Instructions (Signed)
Use adviar inhaler 2 puffs twice daily as needed  Continue to use duoneb treatments as needed  Follow up in 1 year

## 2022-11-05 ENCOUNTER — Other Ambulatory Visit: Payer: Self-pay | Admitting: Pulmonary Disease

## 2022-12-05 ENCOUNTER — Ambulatory Visit: Payer: Medicare Other | Attending: Nurse Practitioner | Admitting: Nurse Practitioner

## 2022-12-05 ENCOUNTER — Ambulatory Visit: Payer: Medicare Other | Admitting: Nurse Practitioner

## 2022-12-05 ENCOUNTER — Encounter: Payer: Self-pay | Admitting: Nurse Practitioner

## 2022-12-05 VITALS — BP 126/66 | HR 74 | Ht 65.5 in | Wt 175.0 lb

## 2022-12-05 DIAGNOSIS — I700 Atherosclerosis of aorta: Secondary | ICD-10-CM | POA: Diagnosis not present

## 2022-12-05 DIAGNOSIS — E782 Mixed hyperlipidemia: Secondary | ICD-10-CM | POA: Diagnosis present

## 2022-12-05 DIAGNOSIS — I2510 Atherosclerotic heart disease of native coronary artery without angina pectoris: Secondary | ICD-10-CM | POA: Diagnosis not present

## 2022-12-05 DIAGNOSIS — I4719 Other supraventricular tachycardia: Secondary | ICD-10-CM | POA: Insufficient documentation

## 2022-12-05 DIAGNOSIS — Z79899 Other long term (current) drug therapy: Secondary | ICD-10-CM | POA: Insufficient documentation

## 2022-12-05 DIAGNOSIS — K219 Gastro-esophageal reflux disease without esophagitis: Secondary | ICD-10-CM | POA: Insufficient documentation

## 2022-12-05 MED ORDER — ATORVASTATIN CALCIUM 20 MG PO TABS: 20.0000 mg | ORAL_TABLET | Freq: Every day | ORAL | 3 refills | Status: DC

## 2022-12-05 NOTE — Progress Notes (Unsigned)
Cardiology Office Note:    Date:  12/05/2022   ID:  Zayley, Arras 1941/04/25, MRN [id number]  PCP:  Theodoro Kos, MD   [facility] Providers Cardiologist:  Nona Dell, MD { Click to update primary MD,subspecialty MD or APP then REFRESH:1}    Referring MD: Theodoro Kos, MD   No chief complaint on file. ***  History of Present Illness:    Cathy [name] is a 81 y.o. female with a hx of ***  Paroxysmal atrial tachycardia GERD Asthma   Patient is a 81 year old female with past medical history as mentioned above.    First one at [facility] - ep there. 2 heart catheterization.  Was previously evaluated at [facility] cardiology.  Echocardiogram from 2014 revealed normal EF, grade 2 DD, mild MR, mildly elevated PASP.  Previous history of "tachycardia" possibly PSVT or atrial tachycardia, limited records.   She was referred to cardiology consultation by Dr. Wynona Neat for evaluation of shortness of breath in 2022.  Noticed more worsening shortness of breath over the last year.  Noted having to take a break while climbing a flight of stairs.  Denied any chest pain, syncope, or palpitations. Echocardiogram was ordered and revealed normal EF, mild diastolic dysfunction, normal PASP, mild MR, small anterior pericardial effusion. Myoview was arranged and was normal.  No ischemia noted.  No arrhythmias, low risk.  Was told to follow-up pending results.  Today she presents for follow-up.  She states she would like to talk about cholesterol medication.  She states.    Very little, cp.   Acid reflux.      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   Increase atoravastin 20 mg daily Flp and cmet in 2 months.          Past Medical History:  Diagnosis Date   Allergic rhinitis    Arthritis    Asthma    Cataract 01/2018   GERD (gastroesophageal reflux disease)    H. pylori infection    Hiatal hernia    History of  adenomatous polyp of colon    History of blood transfusion    Hyperlipidemia    Internal hemorrhoids    Osteoporosis    Paroxysmal supraventricular tachycardia    Wound infection after surgery 03/2016    Past Surgical History:  Procedure Laterality Date   ANKLE FRACTURE SURGERY Left 12/2015   APPENDECTOMY     BILATERAL SALPINGOOPHORECTOMY     COLONOSCOPY  02/2108   polyps removed   I & D EXTREMITY N/A 03/12/2016   Procedure: IRRIGATION AND DEBRIDEMENT EXTREMITY;  Surgeon: Toni Arthurs, MD;  Location: [facility] OR;  Service: Orthopedics;  Laterality: N/A;   squamous cell cancer face     TOE SURGERY     TONSILLECTOMY     TOTAL ABDOMINAL HYSTERECTOMY     vocal cord nodules      Current Medications: No outpatient medications have been marked as taking for the 12/05/22 encounter (Appointment) with Sharlene Dory, NP.     Allergies:   Keflex [cephalexin], Ciprofloxacin, Penicillins, and Sulfonamide derivatives   Social History   Socioeconomic History   Marital status: Married    Spouse name: Not on file   Number of children: Not on file   Years of education: Not on file   Highest education level: Not on file  Occupational History   Occupation: Architect  Tobacco Use   Smoking status: Never   Smokeless  tobacco: Never  Vaping Use   Vaping Use: Never used  Substance and Sexual Activity   Alcohol use: No    Alcohol/week: 0.0 standard drinks of alcohol   Drug use: No   Sexual activity: Not on file  Other Topics Concern   Not on file  Social History Narrative   Not on file   Social Determinants of Health   Financial Resource Strain: Not on file  Food Insecurity: Not on file  Transportation Needs: Not on file  Physical Activity: Not on file  Stress: Not on file  Social Connections: Not on file     Family History: The patient's ***family history includes CVA in her father and mother; Heart disease in her father. There is no history of Colon cancer, Pancreatic cancer,  Rectal cancer, Stomach cancer, or Esophageal cancer.  ROS:   Please see the history of present illness.    *** All other systems reviewed and are negative.  EKGs/Labs/Other Studies Reviewed:    The following studies were reviewed today: ***  EKG:  EKG is ordered today.  The ekg ordered today demonstrates sinus rhythm, 73 bpm, no acute ischemic changes  Recent Labs: 02/19/2022: ALT 12; BUN 20; Creatinine, Ser 0.94; Hemoglobin 13.3; Platelets 207.0; Potassium 4.3; Sodium 137  Recent Lipid Panel No results found for: "CHOL", "TRIG", "HDL", "CHOLHDL", "VLDL", "LDLCALC", "LDLDIRECT"   Risk Assessment/Calculations:   {Does this patient have ATRIAL FIBRILLATION?:[phone]}  No BP recorded.  {Refresh Note OR Click here to enter BP  :1}***         Physical Exam:    VS:  There were no vitals taken for this visit.    Wt Readings from Last 3 Encounters:  10/28/22 168 lb (76.2 kg)  10/16/22 168 lb 9.6 oz (76.5 kg)  04/21/22 170 lb (77.1 kg)     GEN: *** Well nourished, well developed in no acute distress HEENT: Normal NECK: No JVD; No carotid bruits LYMPHATICS: No lymphadenopathy CARDIAC: ***RRR, no murmurs, rubs, gallops RESPIRATORY:  Clear to auscultation without rales, wheezing or rhonchi  ABDOMEN: Soft, non-tender, non-distended MUSCULOSKELETAL:  No edema; No deformity  SKIN: Warm and dry NEUROLOGIC:  Alert and oriented x 3 PSYCHIATRIC:  Normal affect   ASSESSMENT:    No diagnosis found. PLAN:    In order of problems listed above:  ***      {Are you ordering a CV Procedure (e.g. stress test, cath, DCCV, TEE, etc)?   Press F2        :[id number]}    Medication Adjustments/Labs and Tests Ordered: Current medicines are reviewed at length with the patient today.  Concerns regarding medicines are outlined above.  No orders of the defined types were placed in this encounter.  No orders of the defined types were placed in this encounter.   There are no Patient  Instructions on file for this visit.   Signed, Sharlene Dory, NP  12/05/2022 8:22 AM    [facility]

## 2022-12-05 NOTE — Patient Instructions (Signed)
Medication Instructions:  INCREASE Atorvastatin to 20 mg daily  Labwork: CMET and Lipids in 8 weeks  Testing/Procedures: None today  Follow-Up: 6 months with Dr.McDowell  Any Other Special Instructions Will Be Listed Below (If Applicable).  If you need a refill on your cardiac medications before your next appointment, please call your pharmacy.

## 2023-03-06 ENCOUNTER — Telehealth: Payer: Self-pay | Admitting: Cardiology

## 2023-03-06 NOTE — Telephone Encounter (Signed)
Pt states that she is returning a call that she received earlier this morning. Please advise

## 2023-03-28 ENCOUNTER — Other Ambulatory Visit: Payer: Self-pay | Admitting: Gastroenterology

## 2023-06-14 ENCOUNTER — Other Ambulatory Visit: Payer: Self-pay | Admitting: Gastroenterology

## 2023-06-24 HISTORY — PX: BACK SURGERY: SHX140

## 2023-07-22 ENCOUNTER — Ambulatory Visit: Payer: Medicare HMO | Attending: Cardiology | Admitting: Cardiology

## 2023-07-22 ENCOUNTER — Other Ambulatory Visit: Payer: Self-pay | Admitting: *Deleted

## 2023-07-22 ENCOUNTER — Encounter: Payer: Self-pay | Admitting: Cardiology

## 2023-07-22 VITALS — BP 110/68 | HR 70 | Ht 65.5 in | Wt 176.6 lb

## 2023-07-22 DIAGNOSIS — Z79899 Other long term (current) drug therapy: Secondary | ICD-10-CM

## 2023-07-22 DIAGNOSIS — E782 Mixed hyperlipidemia: Secondary | ICD-10-CM | POA: Diagnosis not present

## 2023-07-22 DIAGNOSIS — I2510 Atherosclerotic heart disease of native coronary artery without angina pectoris: Secondary | ICD-10-CM

## 2023-07-22 MED ORDER — ROSUVASTATIN CALCIUM 20 MG PO TABS: 20.0000 mg | ORAL_TABLET | Freq: Every day | ORAL | 3 refills | Status: DC

## 2023-07-22 NOTE — Progress Notes (Signed)
    Cardiology Office Note  Date: 07/22/2023   ID: Cathy, [name] [birth year], MRN [id number]  History of Present Illness: Cathy [name] is an 82 y.o. female last seen in December 2023 by Ms. Philis Nettle NP, I reviewed the note.  She is here for a follow-up visit.  Reports no significant palpitations or chest discomfort.  She had back surgery in [place] in the interim, reported significant pain due to compressed sciatic nerve with improvement in symptoms subsequently.  She does not report any dizziness or syncope.  I reviewed her medications.  She had lab work in June at which point LDL was 149 on Lipitor 20 mg daily.  We discussed modification and therapy to get LDL closer to goal in the setting of coronary artery calcification and aortic atherosclerosis.  Physical Exam: VS:  BP 110/68   Pulse 70   Ht 5' 5.5" (1.664 m)   Wt 176 lb 9.6 oz (80.1 kg)   SpO2 98%   BMI 28.94 kg/m , BMI Body mass index is 28.94 kg/m.  Wt Readings from Last 3 Encounters:  07/22/23 176 lb 9.6 oz (80.1 kg)  12/05/22 175 lb (79.4 kg)  10/28/22 168 lb (76.2 kg)    General: Patient appears comfortable at rest. HEENT: Conjunctiva and lids normal. Neck: Supple, no elevated JVP or carotid bruits. Lungs: Clear to auscultation, nonlabored breathing at rest. Cardiac: Regular rate and rhythm, no S3 1/6 systolic murmur. Extremities: No pitting edema.  ECG:  An ECG dated 12/05/2022 was personally reviewed today and demonstrated:  Sinus rhythm with IVCD and poor R wave progression.  Labwork:  June 2024: Hemoglobin A1c 6.3%, hemoglobin 13.1, platelets 217, potassium 4.6, BUN 22, creatinine 1.02, AST 24, ALT 14, TSH 2.58, cholesterol 234, triglycerides 77, HDL 70, LDL 149  Other Studies Reviewed Today:  No interval cardiac testing for review today.  Assessment and Plan:  1.  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  She is doing  well without significant palpitations and continues on Coreg.  2.  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  Lexiscan Myoview in April 2022 showed no evidence of ischemia to suggest obstructive CAD and LVEF 69%.  She does not describe any obvious angina at this time with plan to continue observation.  3.  Mixed hyperlipidemia.  LDL 149 in June.  She was on Lipitor 20 mg daily at that time.  Plan to switch to Crestor 20 mg daily and if tolerated recheck FLP and LFTs in 6 months.  Disposition:  Follow up  6 months.  Signed, Jonelle Sidle, M.D., F.A.C.C. [facility]

## 2023-07-22 NOTE — Patient Instructions (Addendum)
Medication Instructions:  Your physician has recommended you make the following change in your medication:  Stop atorvastatin Start rosuvastatin 20 mg daily Continue all other medications as prescribed  Labwork: Your physician recommends that you return for a FASTING lipid/liver profile in 6 months just before your next visit. Please do not eat or drink for at least 8 hours when you have this done. You may take your medications that morning with a sip of water. [employer] or [employer] ([address] [place])  Testing/Procedures: none  Follow-Up: Your physician recommends that you schedule a follow-up appointment in: 6 months  Any Other Special Instructions Will Be Listed Below (If Applicable).  If you need a refill on your cardiac medications before your next appointment, please call your pharmacy.

## 2023-09-16 ENCOUNTER — Other Ambulatory Visit: Payer: Self-pay | Admitting: Internal Medicine

## 2023-09-16 DIAGNOSIS — Z1231 Encounter for screening mammogram for malignant neoplasm of breast: Secondary | ICD-10-CM

## 2023-10-15 ENCOUNTER — Ambulatory Visit: Payer: Medicare HMO | Admitting: Gastroenterology

## 2023-10-15 ENCOUNTER — Encounter: Payer: Self-pay | Admitting: Gastroenterology

## 2023-10-15 VITALS — BP 118/66 | HR 68 | Ht 65.5 in | Wt 176.0 lb

## 2023-10-15 DIAGNOSIS — Z860100 Personal history of colon polyps, unspecified: Secondary | ICD-10-CM | POA: Diagnosis not present

## 2023-10-15 DIAGNOSIS — R1319 Other dysphagia: Secondary | ICD-10-CM

## 2023-10-15 DIAGNOSIS — R1084 Generalized abdominal pain: Secondary | ICD-10-CM

## 2023-10-15 DIAGNOSIS — K219 Gastro-esophageal reflux disease without esophagitis: Secondary | ICD-10-CM | POA: Diagnosis not present

## 2023-10-15 MED ORDER — SUCRALFATE 1 GM/10ML PO SUSP: 1.0000 g | Freq: Four times a day (QID) | ORAL | 0 refills | Status: DC

## 2023-10-15 MED ORDER — NA SULFATE-K SULFATE-MG SULF 17.5-3.13-1.6 GM/177ML PO SOLN: 1.0000 | Freq: Once | ORAL | 0 refills | Status: AC

## 2023-10-15 NOTE — Patient Instructions (Addendum)
We have sent the following medications to your pharmacy for you to pick up at your convenience: Carafate 10 ml four times daily 20-30 minutes before meals and at bedtime.  You have been scheduled for an endoscopy and colonoscopy. Please follow the written instructions given to you at your visit today.  Please pick up your prep supplies at the pharmacy within the next 1-3 days.  If you use inhalers (even only as needed), please bring them with you on the day of your procedure.  DO NOT TAKE 7 DAYS PRIOR TO TEST- Trulicity (dulaglutide) Ozempic, Wegovy (semaglutide) Mounjaro (tirzepatide) Bydureon Bcise (exanatide extended release)  DO NOT TAKE 1 DAY PRIOR TO YOUR TEST Rybelsus (semaglutide) Adlyxin (lixisenatide) Victoza (liraglutide) Byetta (exanatide) _____________________________________________________________________  _______________________________________________________  If your blood pressure at your visit was 140/90 or greater, please contact your primary care physician to follow up on this.  _______________________________________________________  If you are age 42 or older, your body mass index should be between 23-30. Your Body mass index is 28.84 kg/m. If this is out of the aforementioned range listed, please consider follow up with your Primary Care Provider.  If you are age 7 or younger, your body mass index should be between 19-25. Your Body mass index is 28.84 kg/m. If this is out of the aformentioned range listed, please consider follow up with your Primary Care Provider.   ________________________________________________________  The [facility] providers would like to encourage you to use [facility] to communicate with providers for non-urgent requests or questions.  Due to long hold times on the telephone, sending your provider a message by [facility] may be a faster and more efficient way to get a response.  Please allow 48 business hours for a response.  Please  remember that this is for non-urgent requests.  _______________________________________________________

## 2023-10-15 NOTE — Progress Notes (Signed)
10/15/2023 Claudean Leavelle [id number] 30-Sep-1941   HISTORY OF PRESENT ILLNESS: This is an 82 year old female who is a patient of Dr. Lauro Franklin.  She presents here today wanting to discuss EGD and colonoscopy.  Has history of colon polyps as below.  She says that despite taking her pantoprazole 40 mg daily she still feels like she is having a lot of acid reflux and describes burning into her chest.  Says that she gets hoarse and cannot talk.  Has occasional nausea.  Occasional dysphagia to solid food and feels like it sits in the esophagus.  Usually drinks stuff to help it move down.  She also complains of intermittent mid abdominal pain.  This seems to be multifocal, just generalized.  She says that it comes and goes.  Not having any pain currently.  She says that she uses MiraLAX every 2 to 3 days and has been moving her bowels well recently.  CT scan March 2023 just showed moderate stool throughout the colon.  She just had back surgery in July and did well with that.   EGD 11/2020:   - Normal esophagus. - 2 cm hiatal hernia. - Erythematous mucosa in the gastric fundus. Biopsied to evaluate for H. Pylori infection. - Normal gastric body, incisura and antrum. Biopsied to evaluate for H. Pylori infection. - Normal examined duodenum.   1. Surgical [P], gastric antrum - ANTRAL MUCOSA WITH MILDLY ACTIVE CHRONIC HELICOBACTER PYLORI GASTRITIS. - WARTHIN-STARRY POSITIVE FOR HELICOBACTER PYLORI. - NO INTESTINAL METAPLASIA, DYSPLASIA OR CARCINOMA. 2. Surgical [P], stomach, fundus, gastric body - OXYNTIC MUCOSA WITH MILDLY ACTIVE CHRONIC HELICOBACTER PYLORI GASTRITIS. - WARTHIN-STARRY POSITIVE FOR HELICOBACTER PYLORI. - NO INTESTINAL METAPLASIA, DYSPLASIA OR CARCINOMA.   Was treated for H. pylori with Talicia and then had a negative breath test following treatment 03/2021.   Her last colonoscopy was in 2019, March which showed a transverse 5 mm polyp found to be adenomatous.  Scattered  diverticulosis and internal hemorrhoids.  Past Medical History:  Diagnosis Date   Allergic rhinitis    Arthritis    Asthma    Cataract 01/2018   GERD (gastroesophageal reflux disease)    H. pylori infection    Hiatal hernia    History of adenomatous polyp of colon    History of blood transfusion    Hyperlipidemia    Internal hemorrhoids    Osteoporosis    Paroxysmal supraventricular tachycardia (HCC)    Wound infection after surgery 03/2016   Past Surgical History:  Procedure Laterality Date   ANKLE FRACTURE SURGERY Left 12/2015   APPENDECTOMY     BACK SURGERY N/A 06/24/2023   BILATERAL SALPINGOOPHORECTOMY     COLONOSCOPY  02/2108   polyps removed   I & D EXTREMITY N/A 03/12/2016   Procedure: IRRIGATION AND DEBRIDEMENT EXTREMITY;  Surgeon: Toni Arthurs, MD;  Location: [facility] OR;  Service: Orthopedics;  Laterality: N/A;   squamous cell cancer face     TOE SURGERY     TONSILLECTOMY     TOTAL ABDOMINAL HYSTERECTOMY     vocal cord nodules      reports that she has never smoked. She has never used smokeless tobacco. She reports that she does not drink alcohol and does not use drugs. family history includes CVA in her father and mother; Heart disease in her father. Allergies  Allergen Reactions   Keflex [Cephalexin] Hives   Ciprofloxacin Other (See Comments)    "bothered achilles tendon"   Penicillins Rash   Sulfonamide Derivatives  Rash      Outpatient Encounter Medications as of 10/15/2023  Medication Sig   atorvastatin (LIPITOR) 20 MG tablet Take 20 mg by mouth daily.   HYDROcodone-acetaminophen (NORCO/VICODIN) 5-325 MG tablet Take 2 tablets by mouth as needed.   lactulose (CHRONULAC) 10 GM/15ML solution Take 20 g by mouth 2 (two) times daily.   polyethylene glycol powder (GLYCOLAX/MIRALAX) 17 GM/SCOOP powder Take 1 Container by mouth daily.   albuterol (PROVENTIL HFA) 108 (90 Base) MCG/ACT inhaler Inhale 1-2 puffs into the lungs every 6 (six) hours as needed for wheezing or  shortness of breath.   azelastine (ASTELIN) 0.1 % nasal spray Place 1 spray into both nostrils at bedtime.   carvedilol (COREG) 6.25 MG tablet Take 1 tablet by mouth 2 (two) times daily with a meal.   cetirizine (ZYRTEC) 10 MG tablet Take 10 mg by mouth daily.   fluticasone-salmeterol (ADVAIR HFA) 115-21 MCG/ACT inhaler Inhale 2 puffs into the lungs 2 (two) times daily as needed.   ipratropium-albuterol (DUONEB) 0.5-2.5 (3) MG/3ML SOLN USE 3 ML VIA NEBULIZER TWICE DAILY   levothyroxine (SYNTHROID) 50 MCG tablet Take 50 mcg by mouth.   meloxicam (MOBIC) 15 MG tablet Take 15 mg by mouth daily.   pantoprazole (PROTONIX) 40 MG tablet TAKE 1 TABLET BY MOUTH TWICE A DAY   rosuvastatin (CRESTOR) 20 MG tablet Take 1 tablet (20 mg total) by mouth daily.   Vitamin D, Ergocalciferol, (DRISDOL) 50000 units CAPS capsule Take 1 capsule by mouth once a week.   [DISCONTINUED] montelukast (SINGULAIR) 10 MG tablet Take 10 mg by mouth at bedtime.   No facility-administered encounter medications on file as of 10/15/2023.    REVIEW OF SYSTEMS  : All other systems reviewed and negative except where noted in the History of Present Illness.   PHYSICAL EXAM: BP 118/66   Pulse 68   Ht 5' 5.5" (1.664 m)   Wt 176 lb (79.8 kg)   BMI 28.84 kg/m  General: Well developed white female in no acute distress Head: Normocephalic and atraumatic Eyes:  Sclerae anicteric, conjunctiva pink. Ears: Normal auditory acuity Lungs: Clear throughout to auscultation; no W/R/R. Heart: Regular rate and rhythm; no M/R/G. Abdomen: Soft, non-distended.  BS present.  Non-tender. Rectal:  Will be done at the time of colonoscopy. Musculoskeletal: Symmetrical with no gross deformities  Skin: No lesions on visible extremities Extremities: No edema  Neurological: Alert oriented x 4, grossly non-focal Psychological:  Alert and cooperative. Normal mood and affect  ASSESSMENT AND PLAN: *Personal history of colon polyps: Had a 5 mm tubular  adenoma removed in 2019.  Was told recommended repeat in 5 years.  She would like to proceed with 1 more colonoscopy.  Will schedule Dr. Rhea Belton. *GERD: Still feels like she is having a lot of symptoms despite pantoprazole 40 mg twice daily.  Describes burning into her chest.  Will plan for EGD with Dr. Rhea Belton as well.  Will add Carafate suspension 4 times daily for the next couple weeks and see if that helps.  Prescription sent to pharmacy.  Describes some dysphagia to solid food as well.  Consider dilation. *Generalized abdominal pain: Will evaluate with above studies and see how she does with the Carafate.  Advised to be sure that she is moving her bowels well, and she feels like she is at this moment.  CT scan last was March 2023 that really just showed moderate stool throughout the colon.  ?  Consider antispasmodic.   CC:  Early Chars  B, MD

## 2023-10-16 ENCOUNTER — Telehealth: Payer: Self-pay | Admitting: Gastroenterology

## 2023-10-16 MED ORDER — SUCRALFATE 1 G PO TABS: ORAL_TABLET | ORAL | 0 refills | Status: DC

## 2023-10-16 NOTE — Telephone Encounter (Signed)
Carafate pill sent to pharmacy.

## 2023-10-16 NOTE — Telephone Encounter (Signed)
Inbound call fro patient in regards to Carafate prescription sent into the pharmacy yesterday. Patient requesting tablet instead of liquid form. Please advise.

## 2023-10-17 ENCOUNTER — Other Ambulatory Visit: Payer: Self-pay | Admitting: Pulmonary Disease

## 2023-10-20 ENCOUNTER — Ambulatory Visit
Admission: RE | Admit: 2023-10-20 | Discharge: 2023-10-20 | Disposition: A | Discharge status: Home / Self Care | Payer: Medicare HMO | Source: Ambulatory Visit | Attending: Internal Medicine | Admitting: Internal Medicine

## 2023-10-20 ENCOUNTER — Ambulatory Visit: Payer: Medicare HMO

## 2023-10-20 DIAGNOSIS — Z1231 Encounter for screening mammogram for malignant neoplasm of breast: Secondary | ICD-10-CM

## 2023-10-20 NOTE — Progress Notes (Signed)
Addendum: Reviewed and agree with assessment and management plan. Kadijah Shamoon M, MD  

## 2023-10-22 ENCOUNTER — Other Ambulatory Visit: Payer: Self-pay | Admitting: Pulmonary Disease

## 2023-10-26 ENCOUNTER — Telehealth: Payer: Self-pay | Admitting: Gastroenterology

## 2023-10-26 NOTE — Telephone Encounter (Signed)
PT wants to know can she use magnesium citrate instead of Suprep because it is too expensive. Please advise.

## 2023-11-02 NOTE — Telephone Encounter (Signed)
Spoke with patient and informed her I would send instructions for a Miralax prep. Instructions sent via  [employer]

## 2023-11-10 ENCOUNTER — Other Ambulatory Visit: Payer: Self-pay | Admitting: Pulmonary Disease

## 2023-11-10 DIAGNOSIS — J4520 Mild intermittent asthma, uncomplicated: Secondary | ICD-10-CM

## 2023-11-22 ENCOUNTER — Other Ambulatory Visit: Payer: Self-pay | Admitting: Nurse Practitioner

## 2023-12-08 ENCOUNTER — Other Ambulatory Visit: Payer: Self-pay | Admitting: Pulmonary Disease

## 2023-12-08 DIAGNOSIS — J4520 Mild intermittent asthma, uncomplicated: Secondary | ICD-10-CM

## 2023-12-20 ENCOUNTER — Other Ambulatory Visit: Payer: Self-pay | Admitting: Gastroenterology

## 2023-12-21 HISTORY — PX: BACK SURGERY: SHX140

## 2023-12-30 ENCOUNTER — Encounter: Payer: Medicare HMO | Admitting: Internal Medicine

## 2024-01-07 ENCOUNTER — Telehealth: Payer: Self-pay | Admitting: Pulmonary Disease

## 2024-01-07 NOTE — Telephone Encounter (Signed)
PT's husband (DPR) calling. She usually gets Advair 115/21 Mist and they got Wixela 250/50 Powder today. They wondered if that was the same. Concerned w/dosage too.  Pls call @ [phone]

## 2024-01-08 NOTE — Telephone Encounter (Signed)
Spouse wanted to know why inhaler was changed fromAdvair hfa to [place]? Spouse informed [place] is what provider sent as they are similar.   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   She has mild scattered areas of bronchiectasis. She is to transition from flovent 1-2 puffs twice daily as needed to advair HFA 115-39mcg 2 puffs twice daily as needed.     She is to continue duoneb nebulizer treatments 2-3 times per day followed by flutter valve for airway clearance.     Her bronchiectasis is possibly secondary to chronic reflux. She is to sleep with the head of her bed elevated to prevent nocturnal reflux issues.   Follow up in 1 year. HRCT chest is stable, no further imaging required at this time.   Melody Comas, MD

## 2024-01-08 NOTE — Telephone Encounter (Signed)
 NFN

## 2024-01-08 NOTE — Telephone Encounter (Signed)
Husband is calling for an update on his wife's inhalers. He can be reached at [phone](Cell)

## 2024-01-21 ENCOUNTER — Telehealth: Payer: Self-pay | Admitting: Cardiology

## 2024-01-21 ENCOUNTER — Telehealth: Payer: Self-pay | Admitting: Gastroenterology

## 2024-01-21 NOTE — Telephone Encounter (Signed)
Inbound call from patient stating she is scheduled for a colonoscopy and endoscopy on 2/18. Patient is requesting a call to discuss if she needs to stop taking her Protonix. Please advise.

## 2024-01-21 NOTE — Telephone Encounter (Signed)
Patient c/o Palpitations:  STAT if patient reporting lightheadedness, shortness of breath, or chest pain  How long have you had palpitations/irregular HR/ Afib? Are you having the symptoms now? Irregular beats  Are you currently experiencing lightheadedness, SOB or CP? no  Do you have a history of afib (atrial fibrillation) or irregular heart rhythm? tachycardia  Have you checked your BP or HR? (document readings if available): no  Are you experiencing any other symptoms? No   Therapist just wanted [place] to know. It they need to modify he PT call them at [phone] Opt 2

## 2024-01-22 NOTE — Telephone Encounter (Signed)
Detailed voice message left informing Cathy [name] that patient does have OV scheduled next Thursday with Dr. Diona Browner - she can discuss her symptoms with provider at that time.

## 2024-01-22 NOTE — Telephone Encounter (Signed)
Informed patient she may take her Pantoprazole. Informed patient to keep [place] posted with her cardiologist.

## 2024-01-22 NOTE — Telephone Encounter (Signed)
Inbound call from patient requesting to reschedule procedures. States she is currently having Afib complications. Patient has been rescheduled for 4/3. States she has an appointment with cardiologist soon. Please advise, thank you.

## 2024-01-26 ENCOUNTER — Encounter: Payer: Medicare HMO | Admitting: Internal Medicine

## 2024-01-27 ENCOUNTER — Encounter: Payer: Self-pay | Admitting: *Deleted

## 2024-01-27 ENCOUNTER — Ambulatory Visit: Payer: 59 | Admitting: Cardiology

## 2024-01-29 ENCOUNTER — Telehealth: Payer: Self-pay | Admitting: Cardiology

## 2024-01-29 ENCOUNTER — Ambulatory Visit: Payer: Medicare HMO

## 2024-01-29 ENCOUNTER — Encounter: Payer: Self-pay | Admitting: Cardiology

## 2024-01-29 ENCOUNTER — Ambulatory Visit: Payer: Medicare HMO | Attending: Cardiology | Admitting: Cardiology

## 2024-01-29 ENCOUNTER — Other Ambulatory Visit: Payer: Self-pay | Admitting: Cardiology

## 2024-01-29 VITALS — BP 118/72 | HR 75 | Ht 65.5 in | Wt 178.8 lb

## 2024-01-29 DIAGNOSIS — R002 Palpitations: Secondary | ICD-10-CM

## 2024-01-29 DIAGNOSIS — I4719 Other supraventricular tachycardia: Secondary | ICD-10-CM

## 2024-01-29 DIAGNOSIS — Z79899 Other long term (current) drug therapy: Secondary | ICD-10-CM | POA: Diagnosis not present

## 2024-01-29 DIAGNOSIS — I2510 Atherosclerotic heart disease of native coronary artery without angina pectoris: Secondary | ICD-10-CM

## 2024-01-29 DIAGNOSIS — E782 Mixed hyperlipidemia: Secondary | ICD-10-CM | POA: Diagnosis not present

## 2024-01-29 NOTE — Progress Notes (Signed)
    Cardiology Office Note  Date: 01/29/2024   ID: Marielis, Samara [birth year], MRN [id number]  History of Present Illness: Navy Rothschild is an 83 y.o. female last seen in August 2024.  She is here for a routine visit.  States that she underwent repeat back surgery in January, still working on PT but feels better and weaning off of pain medications.  She has had intermittent palpitations and documented tachycardia at times, known history of previously documented PSVT versus atrial tachycardia per outside evaluation.  She states that she got an "atrial fibrillation" alert from her activity tracker however.  I reviewed her medications, she has continued on Lipitor 20 mg daily although we did discuss advancing statin therapy given her last LDL of 149 in June 2024.  Physical Exam: VS:  BP 118/72   Pulse 75   Ht 5' 5.5" (1.664 m)   Wt 178 lb 12.8 oz (81.1 kg)   SpO2 97%   BMI 29.30 kg/m , BMI Body mass index is 29.3 kg/m.  Wt Readings from Last 3 Encounters:  01/29/24 178 lb 12.8 oz (81.1 kg)  10/15/23 176 lb (79.8 kg)  07/22/23 176 lb 9.6 oz (80.1 kg)    General: Patient appears comfortable at rest. HEENT: Conjunctiva and lids normal. Neck: Supple, no elevated JVP or carotid bruits. Lungs: Clear to auscultation, nonlabored breathing at rest. Cardiac: Regular rate and rhythm, no S3, 1/6 systolic murmur. Extremities: No pitting edema.  ECG:  An ECG dated 12/05/2022 was personally reviewed today and demonstrated:  Sinus rhythm with IVCD and poor R wave progression.  Labwork:  June 2024: Cholesterol 234, HDL 70, LDL 149, triglycerides 77 October 2024: Hemoglobin A1c 5.8%, TSH 2.557 January 2025: Hemoglobin 12.3, platelets 237, potassium 3.9, BUN 14, creatinine 0.89, GFR 64  Other Studies Reviewed Today:  No interval cardiac testing for review today.  Assessment and Plan:  1.  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  She has had increasing palpitations recently, remains on Coreg 6.25 mg twice daily.  Plan to place a 7-day ZIO monitor mainly to exclude atrial fibrillation.   2.  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  Lexiscan Myoview in April 2022 showed no evidence of ischemia to suggest obstructive CAD and LVEF 69%.  She remains asymptomatic.  Increase Lipitor to 40 mg daily for better lipid control.   3.  Mixed hyperlipidemia.  LDL 149 in June 2024.  Increase Lipitor to 40 mg daily.  Check FLP in 6 months.  Disposition:  Follow up  6 months.  Signed, Jonelle Sidle, M.D., F.A.C.C. [facility]

## 2024-01-29 NOTE — Patient Instructions (Addendum)
 Medication Instructions:  Your physician recommends that you continue on your current medications as directed. Please refer to the Current Medication list given to you today.  Labwork: Your physician recommends that you return for a FASTING lipid profile in 6 months before your next visit. Please do not eat or drink for at least 8 hours when you have this done. You may take your medications that morning with a sip of water. [employer] ([address] [place])  Testing/Procedures: 7 Day ZIO XT  Follow-Up: Your physician recommends that you schedule a follow-up appointment in: 6 months  Any Other Special Instructions Will Be Listed Below (If Applicable).  If you need a refill on your cardiac medications before your next appointment, please call your pharmacy.

## 2024-01-29 NOTE — Telephone Encounter (Signed)
 Checking percert on the following   7 Day ZIO XT dx: palpitations

## 2024-02-04 ENCOUNTER — Encounter: Payer: Self-pay | Admitting: Cardiology

## 2024-02-09 ENCOUNTER — Telehealth: Payer: Self-pay | Admitting: Pulmonary Disease

## 2024-02-09 MED ORDER — FLUTICASONE-SALMETEROL 115-21 MCG/ACT IN AERO: 2.0000 | INHALATION_SPRAY | Freq: Two times a day (BID) | RESPIRATORY_TRACT | 1 refills | Status: DC

## 2024-02-09 NOTE — Telephone Encounter (Signed)
 Called patient.  Gave information per Dr. Francine Graven.  Patient wants Advair HFA 115-32mcg 2 puffs twice daily as needed to go to [facility] [address] in [place], [place]. Sent in rx.  Patient verbalized understanding.

## 2024-02-09 NOTE — Telephone Encounter (Signed)
 Patient states Cathy [name] is making her hoarse. Would like to go back on Advair. Patient out of the Advair.Pharmacy is [facility]. Patient phone number is [phone].

## 2024-02-09 NOTE — Telephone Encounter (Signed)
 Lm for patient.

## 2024-02-09 NOTE — Telephone Encounter (Addendum)
 Called and spoke to patient. She stated that [place] Fant[place] is causing voice hoarseness and she would like to switch back to Advair.  Verified with pt that she rinses her mouth out after each use.    Pt last seen in 10/2022. Pending appt 04/06/2024. This medication was prescribed by Dr. Francine Graven.  Dr. Francine Graven, please advise. Thanks

## 2024-02-12 ENCOUNTER — Telehealth: Payer: Self-pay | Admitting: Cardiology

## 2024-02-12 NOTE — Telephone Encounter (Signed)
 Noted.

## 2024-02-12 NOTE — Telephone Encounter (Signed)
 New Message:     Patient was in the clinic this week with her. She said she had mailed back her heart monitor on last Friday(02-05-24). She had no issues while wearing the monitor. After she mailed back the monitor last Friday, she had an episode. She felt like her heart was beating fast for 20 seconds, since that time no other episode. She said she will proceed with  patient's Physical Therapy activities tolerated.

## 2024-02-16 ENCOUNTER — Telehealth: Payer: Self-pay | Admitting: *Deleted

## 2024-02-16 NOTE — Telephone Encounter (Signed)
-----   Message from Nona Dell sent at 02/15/2024  5:54 PM EDT ----- Results reviewed.  Cardiac monitor does show multiple episodes of PSVT, generally brief although the longest episode lasted nearly 27 seconds.  No definite atrial fibrillation.  Depending on her symptoms, we could consider switching Coreg to a different beta-blocker if she would like to try.  See how she is doing.

## 2024-02-16 NOTE — Telephone Encounter (Signed)
 Patient informed. Copy sent to PCP Reports she feels fine and will contact office if she wanted to change beta blocker.

## 2024-02-23 ENCOUNTER — Other Ambulatory Visit: Payer: Self-pay | Admitting: Cardiology

## 2024-03-10 ENCOUNTER — Encounter: Payer: Self-pay | Admitting: Internal Medicine

## 2024-03-10 ENCOUNTER — Ambulatory Visit: Payer: Medicare HMO | Admitting: Internal Medicine

## 2024-03-10 VITALS — BP 104/50 | HR 54 | Temp 97.0°F | Resp 14 | Ht 65.0 in | Wt 176.0 lb

## 2024-03-10 DIAGNOSIS — Z1211 Encounter for screening for malignant neoplasm of colon: Secondary | ICD-10-CM | POA: Diagnosis present

## 2024-03-10 DIAGNOSIS — K449 Diaphragmatic hernia without obstruction or gangrene: Secondary | ICD-10-CM | POA: Diagnosis not present

## 2024-03-10 DIAGNOSIS — K5730 Diverticulosis of large intestine without perforation or abscess without bleeding: Secondary | ICD-10-CM

## 2024-03-10 DIAGNOSIS — K2100 Gastro-esophageal reflux disease with esophagitis, without bleeding: Secondary | ICD-10-CM | POA: Diagnosis not present

## 2024-03-10 DIAGNOSIS — K219 Gastro-esophageal reflux disease without esophagitis: Secondary | ICD-10-CM | POA: Diagnosis not present

## 2024-03-10 DIAGNOSIS — Z8619 Personal history of other infectious and parasitic diseases: Secondary | ICD-10-CM

## 2024-03-10 DIAGNOSIS — Z860101 Personal history of adenomatous and serrated colon polyps: Secondary | ICD-10-CM

## 2024-03-10 DIAGNOSIS — Z860100 Personal history of colon polyps, unspecified: Secondary | ICD-10-CM

## 2024-03-10 DIAGNOSIS — K209 Esophagitis, unspecified without bleeding: Secondary | ICD-10-CM

## 2024-03-10 MED ORDER — SODIUM CHLORIDE 0.9 % IV SOLN: 500.0000 mL | INTRAVENOUS | Status: DC

## 2024-03-10 NOTE — Op Note (Signed)
 [facility] Patient Name: Cathy [name] Procedure Date: 03/10/2024 10:52 AM MRN: [id number] Endoscopist: Beverley Fiedler , MD, [id number] Age: 83 Referring MD:  Date of Birth: [birth year] Gender: Female Account #: [id number] Procedure:                Upper GI endoscopy Indications:              Epigastric abdominal pain, Gastro-esophageal reflux                            disease despite BID pantoprazole 40 mg; hx of H                            Pylori last in 2022 Medicines:                Monitored Anesthesia Care Procedure:                Pre-Anesthesia Assessment:                           - Prior to the procedure, a History and Physical                            was performed, and patient medications and                            allergies were reviewed. The patient's tolerance of                            previous anesthesia was also reviewed. The risks                            and benefits of the procedure and the sedation                            options and risks were discussed with the patient.                            All questions were answered, and informed consent                            was obtained. Prior Anticoagulants: The patient has                            taken no anticoagulant or antiplatelet agents. ASA                            Grade Assessment: II - A patient with mild systemic                            disease. After reviewing the risks and benefits,                            the patient was deemed in satisfactory condition to  undergo the procedure.                           After obtaining informed consent, the endoscope was                            passed under direct vision. Throughout the                            procedure, the patient's blood pressure, pulse, and                            oxygen saturations were monitored continuously. The                            GIF [id number] #[id number] was introduced  through the                            mouth, and advanced to the second part of duodenum.                            The upper GI endoscopy was accomplished without                            difficulty. The patient tolerated the procedure                            well. Scope In: Scope Out: Findings:                 LA Grade B (one or more mucosal breaks greater than                            5 mm, not extending between the tops of two mucosal                            folds) esophagitis with no bleeding was found in                            the distal esophagus.                           A 2 cm hiatal hernia was present.                           The entire examined stomach was normal. Biopsies                            were taken with a cold forceps for Helicobacter                            pylori testing.                           The examined duodenum was normal. Complications:  No immediate complications. Estimated Blood Loss:     Estimated blood loss: none. Impression:               - LA Grade B reflux esophagitis with no bleeding.                           - 2 cm hiatal hernia.                           - Normal stomach. Biopsied.                           - Normal examined duodenum. Recommendation:           - Patient has a contact number available for                            emergencies. The signs and symptoms of potential                            delayed complications were discussed with the                            patient. Return to normal activities tomorrow.                            Written discharge instructions were provided to the                            patient.                           - Resume previous diet.                           - Continue present medications. Given esophagitis                            on BID pantoprazole I would recommend trial of                            vonoprazan 20 mg once daily. This replaces                             pantoprazole.                           - Await pathology results.                           - Office follow-up in 3-4 months for continuity and                            to ensure reflux improvement. Beverley Fiedler, MD 03/10/2024 11:36:04 AM This report has been signed electronically.

## 2024-03-10 NOTE — Progress Notes (Signed)
 To pacu, VSS. Report to [email or web address]

## 2024-03-10 NOTE — Op Note (Signed)
  [facility] Patient Name: Cathy [name] Procedure Date: 03/10/2024 10:44 AM MRN: [id number] Endoscopist: Beverley Fiedler , MD, [id number] Age: 83 Referring MD:  Date of Birth: 1940/12/30 Gender: Female Account #: [id number] Procedure:                Colonoscopy Indications:              High risk colon cancer surveillance: Personal                            history of non-advanced adenoma (TA x 1 in 2019) Medicines:                Monitored Anesthesia Care Procedure:                Pre-Anesthesia Assessment:                           - Prior to the procedure, a History and Physical                            was performed, and patient medications and                            allergies were reviewed. The patient's tolerance of                            previous anesthesia was also reviewed. The risks                            and benefits of the procedure and the sedation                            options and risks were discussed with the patient.                            All questions were answered, and informed consent                            was obtained. Prior Anticoagulants: The patient has                            taken no anticoagulant or antiplatelet agents. ASA                            Grade Assessment: II - A patient with mild systemic                            disease. After reviewing the risks and benefits,                            the patient was deemed in satisfactory condition to                            undergo the procedure.  After obtaining informed consent, the colonoscope                            was passed under direct vision. Throughout the                            procedure, the patient's blood pressure, pulse, and                            oxygen saturations were monitored continuously. The                            Olympus Scope SN: [id number] was introduced through                            the anus and  advanced to the cecum, identified by                            appendiceal orifice and ileocecal valve. The                            colonoscopy was performed without difficulty. The                            patient tolerated the procedure well. The quality                            of the bowel preparation was excellent. The                            ileocecal valve, appendiceal orifice, and rectum                            were photographed. Scope In: 11:17:45 AM Scope Out: 11:31:47 AM Scope Withdrawal Time: 0 hours 5 minutes 11 seconds  Total Procedure Duration: 0 hours 14 minutes 2 seconds  Findings:                 The digital rectal exam was normal.                           Multiple medium-mouthed and small-mouthed                            diverticula were found in the sigmoid colon,                            descending colon and transverse colon.                           The exam was otherwise without abnormality on                            direct and retroflexion views. Complications:            No immediate complications.  Estimated Blood Loss:     Estimated blood loss: none. Impression:               - Mild diverticulosis in the sigmoid colon, in the                            descending colon and in the transverse colon.                           - The examination was otherwise normal on direct                            and retroflexion views.                           - No specimens collected. Recommendation:           - Patient has a contact number available for                            emergencies. The signs and symptoms of potential                            delayed complications were discussed with the                            patient. Return to normal activities tomorrow.                            Written discharge instructions were provided to the                            patient.                           - Resume previous diet.                            - Continue present medications.                           - No repeat colonoscopy due to age and the absence                            of colonic polyps. Beverley Fiedler, MD 03/10/2024 11:38:49 AM This report has been signed electronically.

## 2024-03-10 NOTE — Progress Notes (Signed)
 Pt's states no medical or surgical changes since previsit or office visit.

## 2024-03-10 NOTE — Progress Notes (Signed)
 GASTROENTEROLOGY PROCEDURE H&P NOTE   Primary Care Physician: Theodoro Kos, MD    Reason for Procedure:   Hx of polyps, GERD and hx of H Pylori  Plan:    EGD/colon  Patient is appropriate for endoscopic procedure(s) in the ambulatory ([facility]) setting.  The nature of the procedure, as well as the risks, benefits, and alternatives were carefully and thoroughly reviewed with the patient. Ample time for discussion and questions allowed. The patient understood, was satisfied, and agreed to proceed.     HPI: Cathy [name] is a 83 y.o. female who presents for EGD/colon.  Medical history as below.  Tolerated the prep.  No recent chest pain or shortness of breath.  No abdominal pain today.  Past Medical History:  Diagnosis Date   Allergic rhinitis    Arthritis    Asthma    Cataract 01/2018   GERD (gastroesophageal reflux disease)    H. pylori infection    Hiatal hernia    History of adenomatous polyp of colon    History of blood transfusion    Hyperlipidemia    Internal hemorrhoids    Osteoporosis    Paroxysmal supraventricular tachycardia (HCC)    Wound infection after surgery 03/2016    Past Surgical History:  Procedure Laterality Date   ANKLE FRACTURE SURGERY Left 12/2015   APPENDECTOMY     BACK SURGERY N/A 06/24/2023   BACK SURGERY  12/21/2023   infusion   BILATERAL SALPINGOOPHORECTOMY     COLONOSCOPY  02/2108   polyps removed   I & D EXTREMITY N/A 03/12/2016   Procedure: IRRIGATION AND DEBRIDEMENT EXTREMITY;  Surgeon: Toni Arthurs, MD;  Location: [facility] OR;  Service: Orthopedics;  Laterality: N/A;   squamous cell cancer face     TOE SURGERY     TONSILLECTOMY     TOTAL ABDOMINAL HYSTERECTOMY     vocal cord nodules      Prior to Admission medications   Medication Sig Start Date End Date Taking? Authorizing Provider  atorvastatin (LIPITOR) 20 MG tablet TAKE 1 TABLET BY MOUTH EVERY DAY *INCREASED DOSE* 02/23/24  Yes Jonelle Sidle, MD  azelastine (ASTELIN)  0.1 % nasal spray Place 1 spray into both nostrils at bedtime. 07/18/18  Yes [provider]  carvedilol (COREG) 6.25 MG tablet Take 1 tablet by mouth 2 (two) times daily with a meal. 10/26/20  Yes [provider]  cetirizine (ZYRTEC) 10 MG tablet Take 10 mg by mouth daily.   Yes [provider]  ipratropium-albuterol (DUONEB) 0.5-2.5 (3) MG/3ML SOLN USE 3 ML VIA NEBULIZER TWICE DAILY 10/23/23  Yes Martina Sinner, MD  levothyroxine (SYNTHROID) 50 MCG tablet Take 50 mcg by mouth. 05/16/21  Yes [provider]  pantoprazole (PROTONIX) 40 MG tablet TAKE 1 TABLET BY MOUTH TWICE A DAY 12/21/23  Yes Zehr, Shanda Bumps D, PA-C  Vitamin D, Ergocalciferol, (DRISDOL) 50000 units CAPS capsule Take 1 capsule by mouth once a week. 05/21/15  Yes [provider]  albuterol (PROVENTIL HFA) 108 (90 Base) MCG/ACT inhaler Inhale 1-2 puffs into the lungs every 6 (six) hours as needed for wheezing or shortness of breath. 12/11/21 01/28/25  Martina Sinner, MD  fluticasone-salmeterol (ADVAIR HFA) 161-09 MCG/ACT inhaler Inhale 2 puffs into the lungs 2 (two) times daily. Patient not taking: Reported on 03/10/2024 02/09/24   Martina Sinner, MD  meloxicam (MOBIC) 15 MG tablet Take 15 mg by mouth daily.    [provider]  polyethylene glycol powder (GLYCOLAX/MIRALAX)  17 GM/SCOOP powder Take 1 Container by mouth as needed. 09/15/23   [provider]    Current Outpatient Medications  Medication Sig Dispense Refill   atorvastatin (LIPITOR) 20 MG tablet TAKE 1 TABLET BY MOUTH EVERY DAY *INCREASED DOSE* 90 tablet 1   azelastine (ASTELIN) 0.1 % nasal spray Place 1 spray into both nostrils at bedtime.  11   carvedilol (COREG) 6.25 MG tablet Take 1 tablet by mouth 2 (two) times daily with a meal.     cetirizine (ZYRTEC) 10 MG tablet Take 10 mg by mouth daily.     ipratropium-albuterol (DUONEB) 0.5-2.5 (3) MG/3ML SOLN USE 3 ML VIA NEBULIZER TWICE DAILY 180 mL 3    levothyroxine (SYNTHROID) 50 MCG tablet Take 50 mcg by mouth.     pantoprazole (PROTONIX) 40 MG tablet TAKE 1 TABLET BY MOUTH TWICE A DAY 180 tablet 1   Vitamin D, Ergocalciferol, (DRISDOL) 50000 units CAPS capsule Take 1 capsule by mouth once a week.     albuterol (PROVENTIL HFA) 108 (90 Base) MCG/ACT inhaler Inhale 1-2 puffs into the lungs every 6 (six) hours as needed for wheezing or shortness of breath. 8 g 5   fluticasone-salmeterol (ADVAIR HFA) 115-21 MCG/ACT inhaler Inhale 2 puffs into the lungs 2 (two) times daily. (Patient not taking: Reported on 03/10/2024) 1 each 1   meloxicam (MOBIC) 15 MG tablet Take 15 mg by mouth daily.     polyethylene glycol powder (GLYCOLAX/MIRALAX) 17 GM/SCOOP powder Take 1 Container by mouth as needed.     Current Facility-Administered Medications  Medication Dose Route Frequency Provider Last Rate Last Admin   0.9 %  sodium chloride infusion  500 mL Intravenous Continuous Murtaza Shell, Carie Caddy, MD        Allergies as of 03/10/2024 - Review Complete 03/10/2024  Allergen Reaction Noted   Ciprofloxacin Other (See Comments) 04/04/2021   Keflex [cephalexin] Hives 05/01/2016   Penicillins Rash 03/11/2016   Sulfonamide derivatives Rash     Family History  Problem Relation Age of Onset   Heart disease Father    CVA Father    CVA Mother    Colon cancer Neg Hx    Pancreatic cancer Neg Hx    Rectal cancer Neg Hx    Stomach cancer Neg Hx    Esophageal cancer Neg Hx     Social History   Socioeconomic History   Marital status: Married    Spouse name: Not on file   Number of children: Not on file   Years of education: Not on file   Highest education level: Not on file  Occupational History   Occupation: Architect  Tobacco Use   Smoking status: Never   Smokeless tobacco: Never  Vaping Use   Vaping status: Never Used  Substance and Sexual Activity   Alcohol use: No    Alcohol/week: 0.0 standard drinks of alcohol   Drug use: No   Sexual activity:  Not on file  Other Topics Concern   Not on file  Social History Narrative   Not on file   Social Drivers of Health   Financial Resource Strain: Low Risk  (12/22/2023)   Received from [facility]   Overall Financial Resource Strain (CARDIA)    Difficulty of Paying Living Expenses: Not hard at all  Food Insecurity: No Food Insecurity (12/22/2023)   Received from [facility]   Hunger Vital Sign    Worried About Running Out of Food in the Last Year: Never true  Ran Out of Food in the Last Year: Never true  Transportation Needs: No Transportation Needs (12/22/2023)   Received from [facility] - Transportation    Lack of Transportation (Medical): No    Lack of Transportation (Non-Medical): No  Physical Activity: Inactive (12/22/2023)   Received from [facility]   Exercise Vital Sign    Days of Exercise per Week: 0 days    Minutes of Exercise per Session: 0 min  Stress: No Stress Concern Present (12/22/2023)   Received from [facility] - Occupational Stress Questionnaire    Feeling of Stress : Not at all  Social Connections: Unknown (12/22/2023)   Received from [facility]   Social Connection and Isolation Panel [NHANES]    Frequency of Communication with Friends and Family: More than three times a week    Frequency of Social Gatherings with Friends and Family: More than three times a week    Attends Religious Services: Patient declined    Database administrator or Organizations: Patient declined    Attends Banker Meetings: Patient declined    Marital Status: Married  Catering manager Violence: Not At Risk (12/22/2023)   Received from [employer], Afraid, Rape, and Kick questionnaire    Fear of Current or Ex-Partner: No    Emotionally Abused: No    Physically Abused: No    Sexually Abused: No    Physical Exam: Vital signs in last 24 hours: @BP  (!) 140/52   Pulse (!) 53    Temp (!) 97 F (36.1 C)   Ht 5\' 5"  (1.651 m)   Wt 176 lb (79.8 kg)   SpO2 100%   BMI 29.29 kg/m  GEN: NAD EYE: Sclerae anicteric ENT: MMM CV: Non-tachycardic Pulm: CTA b/l GI: Soft, NT/ND NEURO:  Alert & Oriented x 3   Erick Blinks, MD [facility]  03/10/2024 10:53 AM

## 2024-03-10 NOTE — Progress Notes (Signed)
 Called to room to assist during endoscopic procedure.  Patient ID and intended procedure confirmed with present staff. Received instructions for my participation in the procedure from the performing physician.

## 2024-03-10 NOTE — Patient Instructions (Signed)
 Please read handouts provided. Continue present medications. Await pathology results. Office follow-up in 3-4 months for continuity and to ensure reflux improvements.   YOU HAD AN ENDOSCOPIC PROCEDURE TODAY AT THE [facility]:   Refer to the procedure report that was given to you for any specific questions about what was found during the examination.  If the procedure report does not answer your questions, please call your gastroenterologist to clarify.  If you requested that your care partner not be given the details of your procedure findings, then the procedure report has been included in a sealed envelope for you to review at your convenience later.  YOU SHOULD EXPECT: Some feelings of bloating in the abdomen. Passage of more gas than usual.  Walking can help get rid of the air that was put into your GI tract during the procedure and reduce the bloating. If you had a lower endoscopy (such as a colonoscopy or flexible sigmoidoscopy) you may notice spotting of blood in your stool or on the toilet paper. If you underwent a bowel prep for your procedure, you may not have a normal bowel movement for a few days.  Please Note:  You might notice some irritation and congestion in your nose or some drainage.  This is from the oxygen used during your procedure.  There is no need for concern and it should clear up in a day or so.  SYMPTOMS TO REPORT IMMEDIATELY:  Following lower endoscopy (colonoscopy or flexible sigmoidoscopy):  Excessive amounts of blood in the stool  Significant tenderness or worsening of abdominal pains  Swelling of the abdomen that is new, acute  Fever of 100F or higher  Following upper endoscopy (EGD)  Vomiting of blood or coffee ground material  New chest pain or pain under the shoulder blades  Painful or persistently difficult swallowing  New shortness of breath  Fever of 100F or higher  Black, tarry-looking stools  For urgent or emergent issues, a  gastroenterologist can be reached at any hour by calling [phone]. Do not use MyChart messaging for urgent concerns.    DIET:  We do recommend a small meal at first, but then you may proceed to your regular diet.  Drink plenty of fluids but you should avoid alcoholic beverages for 24 hours.  ACTIVITY:  You should plan to take it easy for the rest of today and you should NOT DRIVE or use heavy machinery until tomorrow (because of the sedation medicines used during the test).    FOLLOW UP: Our staff will call the number listed on your records the next business day following your procedure.  We will call around 7:15- 8:00 am to check on you and address any questions or concerns that you may have regarding the information given to you following your procedure. If we do not reach you, we will leave a message.     If any biopsies were taken you will be contacted by phone or by letter within the next 1-3 weeks.  Please call us at [phone] if you have not heard about the biopsies in 3 weeks.    SIGNATURES/CONFIDENTIALITY: You and/or your care partner have signed paperwork which will be entered into your electronic medical record.  These signatures attest to the fact that that the information above on your After Visit Summary has been reviewed and is understood.  Full responsibility of the confidentiality of this discharge information lies with you and/or your care-partner.

## 2024-03-11 ENCOUNTER — Other Ambulatory Visit: Payer: Self-pay

## 2024-03-11 ENCOUNTER — Telehealth: Payer: Self-pay

## 2024-03-11 MED ORDER — VOQUEZNA 20 MG PO TABS: 1.0000 | ORAL_TABLET | Freq: Every day | ORAL | 11 refills | Status: DC

## 2024-03-11 NOTE — Telephone Encounter (Signed)
 Post procedure follow up call, no answer

## 2024-03-15 ENCOUNTER — Encounter: Payer: Self-pay | Admitting: Internal Medicine

## 2024-03-15 LAB — SURGICAL PATHOLOGY

## 2024-03-30 ENCOUNTER — Telehealth: Payer: Self-pay | Admitting: Internal Medicine

## 2024-03-30 NOTE — Telephone Encounter (Signed)
 Patients states she still has not received acid reflux medication from specialty pharmacy. Please advise.   Thank you

## 2024-03-30 NOTE — Telephone Encounter (Signed)
 Called [facility] and spoke with Maya that did receive the prescription for Voquezna  20 mg. Maya states the prescription does require a PA and this was faxed to our office. Informed Maya at [facility] that we did not receive that fax but I will forward this to our PA team to initiate. Maya also states if the patient chooses to not have it ran through her insurance, then they can get the prescription with a savings card for $50.   Called patient with no answer and no voicemail to leave a message.

## 2024-03-31 ENCOUNTER — Other Ambulatory Visit: Payer: Self-pay | Admitting: Internal Medicine

## 2024-03-31 ENCOUNTER — Other Ambulatory Visit (HOSPITAL_COMMUNITY): Payer: Self-pay

## 2024-03-31 ENCOUNTER — Telehealth: Payer: Self-pay

## 2024-03-31 MED ORDER — RABEPRAZOLE SODIUM 20 MG PO TBEC: 20.0000 mg | DELAYED_RELEASE_TABLET | Freq: Two times a day (BID) | ORAL | 1 refills | Status: DC

## 2024-03-31 NOTE — Telephone Encounter (Signed)
 Informed patient we will send this message to our PA team to initiate a PA for Voquezna .

## 2024-03-31 NOTE — Telephone Encounter (Signed)
 Pharmacy Patient Advocate Encounter   Received notification from Pt Calls Messages that prior authorization for Voquezna  20MG  tablets is required/requested.   Insurance verification completed.   The patient is insured through [facility].   Per test claim: Prior Authorization form/request asks a question that requires your assistance. Please see the question below and advise accordingly. The PA will not be submitted until the necessary information is received.

## 2024-03-31 NOTE — Telephone Encounter (Signed)
 Can try achiphex 20 mg BID-AC; GERD with esophagitis  JMP

## 2024-03-31 NOTE — Telephone Encounter (Signed)
 Patient reports she has tried omeprazole, pantoprazole , lansoprazole but cannot recall trying rabeprazole . Dr. Bridgett Camps, insurance is requiring patient try and fail all formulary alternatives. Do you want me to send in rabeprazole ?

## 2024-03-31 NOTE — Telephone Encounter (Signed)
 PA request has been Started. New Encounter has been or will be created for follow up. For additional info see Pharmacy Prior Auth telephone encounter from 03/31/2024.

## 2024-03-31 NOTE — Telephone Encounter (Signed)
 Prescription sent to patient's pharmacy. Informed patient I sent Aciphex  to her pharmacy and to let us  know if this medication does help with her symptoms. Patient verbalized understanding.

## 2024-04-05 ENCOUNTER — Encounter: Payer: Self-pay | Admitting: Internal Medicine

## 2024-04-06 ENCOUNTER — Ambulatory Visit: Admitting: Pulmonary Disease

## 2024-04-06 ENCOUNTER — Encounter: Payer: Self-pay | Admitting: Pulmonary Disease

## 2024-04-06 VITALS — BP 122/74 | HR 86 | Ht 65.0 in | Wt 169.0 lb

## 2024-04-06 DIAGNOSIS — J479 Bronchiectasis, uncomplicated: Secondary | ICD-10-CM

## 2024-04-06 MED ORDER — IPRATROPIUM-ALBUTEROL 0.5-2.5 (3) MG/3ML IN SOLN: 3.0000 mL | Freq: Four times a day (QID) | RESPIRATORY_TRACT | 11 refills | Status: AC | PRN

## 2024-04-06 MED ORDER — FLUTICASONE-SALMETEROL 115-21 MCG/ACT IN AERO: 2.0000 | INHALATION_SPRAY | Freq: Two times a day (BID) | RESPIRATORY_TRACT | 11 refills | Status: AC

## 2024-04-06 MED ORDER — ALBUTEROL SULFATE HFA 108 (90 BASE) MCG/ACT IN AERS: 1.0000 | INHALATION_SPRAY | Freq: Four times a day (QID) | RESPIRATORY_TRACT | 6 refills | Status: AC | PRN

## 2024-04-06 NOTE — Patient Instructions (Addendum)
 Continue advair inhaler (fluticasone -salmeterol) 2 puffs twice daily - rinse mouth out after each use  Continue astelin  nasal spray  Continue duoneb nebulizer treatments as needed  Continue zyrtec for allergies  Follow up in 1 year, call sooner if needed

## 2024-04-06 NOTE — Progress Notes (Signed)
 Synopsis: Referred in November 2022 for asthma and shortness of breath. Former patient of Dr. Linder Revere.  Subjective:   PATIENT ID: Cathy [name] GENDER: female DOB: [birth year], MRN: [id number]  HPI  Chief Complaint  Patient presents with   Follow-up    Pt state cough x4weeks    Cathy [name] is a 83 year old woman, never smoker with history of asthma, GERD and allergic rhinitis who returns to pulmonary clinic for follow up of bronchiectasis.    She uses the Advair inhaler, one puff in the morning and one in the afternoon or night. She experiences a mild cough without phlegm production. She uses a nebulizer machine every morning and the Albuterol  inhaler infrequently. Astelin  nasal spray and Zyrtec are used for allergies, which have been bothersome this spring, with pollen affecting her breathing. She experiences reflux and is awaiting insurance approval for a medication change.  OV 10/28/22 She is using flovent  as needed about 3-4 times per week. She has no night time awakenings.   She is using duoneb treatment each morning. She is coughing up minimal amounts of yellow phlegm.   GERD is well controlled at this time. She is sleeping with head of bed elevated.  She recently had foot surgery.  OV 04/21/22 She has mild scattered areas of cylindrical bronchiectasis. She was instructed to use duoneb nebulizer treatments 2-3 times daily followed by flutter valve. She is using duoneb once daily and flovent  inhaler as needed which has helped with cough. She stopped symbicort  at last visit due to cough and suspected throat irritation which helped but she had on going intermittent cough which the flovent  helps with.   OV 12/16/21 She has not noticed improvement in her shortness of breath since starting symbicort  at last visit. She called on 1/4 with complaints of increased cough. She was encouraged to use her albuterol  inhaler and sleep with the head of the bed elevated given her history of  GERD with some improvement in the cough. She does have sinus congestion with post nasal drainage.  HRCT Chest on 10/29/21 showed mild scattered areas of cylindrical bronchiecatsis with associated thickening of the peribronchovascular interstitium and regional architectural distortion with volume loss in the medial aspect of right upper lobe and inferior segment of the left upper lobe and anterior aspect of the left lower lobe.   She does complain of dry eyes and mouth. Denies joint pains.  OV 10/08/21 She was ill with covid in June 2021 with mild symptoms and did not require hospitalization. Since this time she has noticed exertional dyspnea. 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 She denies any cough, wheezing or chest tightness with the dyspnea. She is using flovent  110mcg 2 puffs twice daily, singulair daily and as needed albuterol  inhaler. She continues to experience GERD symptoms despite being on PPI therapy. She does have sinus congestion and runny nose with complaints of night time post nasal drainage. She uses astelin  nasal spray nightly.   She is a never smoker and denies second hand smoke exposure. No harmful dust or chemical exposures through her work.   She does report snoring at night. She does have daytime fatigue.   Past Medical History:  Diagnosis Date   Allergic rhinitis    Arthritis    Asthma    Cataract 01/2018   GERD (gastroesophageal reflux disease)    H. pylori infection    Hiatal hernia  History of adenomatous polyp of colon    History of blood transfusion    Hyperlipidemia    Internal hemorrhoids    Osteoporosis    Paroxysmal supraventricular tachycardia (HCC)    Wound infection after surgery 03/2016     Family History  Problem Relation Age of Onset   Heart disease Father    CVA Father    CVA Mother    Colon cancer Neg Hx    Pancreatic cancer Neg Hx    Rectal cancer Neg Hx     Stomach cancer Neg Hx    Esophageal cancer Neg Hx      Social History   Socioeconomic History   Marital status: Married    Spouse name: Not on file   Number of children: Not on file   Years of education: Not on file   Highest education level: Not on file  Occupational History   Occupation: Architect  Tobacco Use   Smoking status: Never   Smokeless tobacco: Never  Vaping Use   Vaping status: Never Used  Substance and Sexual Activity   Alcohol use: No    Alcohol/week: 0.0 standard drinks of alcohol   Drug use: No   Sexual activity: Not on file  Other Topics Concern   Not on file  Social History Narrative   Not on file   Social Drivers of Health   Financial Resource Strain: Low Risk  (12/22/2023)   Received from [facility]   Overall Financial Resource Strain (CARDIA)    Difficulty of Paying Living Expenses: Not hard at all  Food Insecurity: No Food Insecurity (12/22/2023)   Received from [facility]   Hunger Vital Sign    Worried About Running Out of Food in the Last Year: Never true    Ran Out of Food in the Last Year: Never true  Transportation Needs: No Transportation Needs (12/22/2023)   Received from [facility] - Transportation    Lack of Transportation (Medical): No    Lack of Transportation (Non-Medical): No  Physical Activity: Inactive (12/22/2023)   Received from [facility]   Exercise Vital Sign    Days of Exercise per Week: 0 days    Minutes of Exercise per Session: 0 min  Stress: No Stress Concern Present (12/22/2023)   Received from [facility] - Occupational Stress Questionnaire    Feeling of Stress : Not at all  Social Connections: Unknown (12/22/2023)   Received from [facility]   Social Connection and Isolation Panel [NHANES]    Frequency of Communication with Friends and Family: More than three times a week    Frequency of Social Gatherings with Friends and Family:  More than three times a week    Attends Religious Services: Patient declined    Database administrator or Organizations: Patient declined    Attends Banker Meetings: Patient declined    Marital Status: Married  Catering manager Violence: Not At Risk (12/22/2023)   Received from [employer], Afraid, Rape, and Kick questionnaire    Fear of Current or Ex-Partner: No    Emotionally Abused: No    Physically Abused: No    Sexually Abused: No     Allergies  Allergen Reactions   Ciprofloxacin Other (See Comments)    "bothered achilles tendon"   Keflex  [Cephalexin ] Hives   Penicillins Rash   Sulfonamide Derivatives Rash     Outpatient Medications Prior to  Visit  Medication Sig Dispense Refill   atorvastatin  (LIPITOR) 20 MG tablet TAKE 1 TABLET BY MOUTH EVERY DAY *INCREASED DOSE* 90 tablet 1   azelastine  (ASTELIN ) 0.1 % nasal spray Place 1 spray into both nostrils at bedtime.  11   carvedilol (COREG) 6.25 MG tablet Take 1 tablet by mouth 2 (two) times daily with a meal.     cetirizine (ZYRTEC) 10 MG tablet Take 10 mg by mouth daily.     levothyroxine (SYNTHROID) 50 MCG tablet Take 50 mcg by mouth.     meloxicam (MOBIC) 15 MG tablet Take 15 mg by mouth daily.     polyethylene glycol powder (GLYCOLAX/MIRALAX) 17 GM/SCOOP powder Take 1 Container by mouth as needed.     RABEprazole  (ACIPHEX ) 20 MG tablet Take 1 tablet (20 mg total) by mouth in the morning and at bedtime. 60 tablet 1   Vitamin D, Ergocalciferol, (DRISDOL) 50000 units CAPS capsule Take 1 capsule by mouth once a week.     albuterol  (PROVENTIL  HFA) 108 (90 Base) MCG/ACT inhaler Inhale 1-2 puffs into the lungs every 6 (six) hours as needed for wheezing or shortness of breath. 8 g 5   fluticasone -salmeterol (ADVAIR HFA) 115-21 MCG/ACT inhaler Inhale 2 puffs into the lungs 2 (two) times daily. 1 each 1   ipratropium-albuterol  (DUONEB) 0.5-2.5 (3) MG/3ML SOLN USE 3 ML VIA NEBULIZER TWICE DAILY 180 mL 3    pantoprazole  (PROTONIX ) 40 MG tablet TAKE 1 TABLET BY MOUTH TWICE A DAY 180 tablet 1   No facility-administered medications prior to visit.   Review of Systems  Constitutional:  Negative for chills, fever, malaise/fatigue and weight loss.  HENT:  Negative for congestion, sinus pain and sore throat.   Eyes: Negative.   Respiratory:  Positive for cough and sputum production. Negative for hemoptysis, shortness of breath and wheezing.   Cardiovascular:  Negative for chest pain, palpitations, orthopnea, claudication and leg swelling.  Gastrointestinal:  Negative for abdominal pain, heartburn, nausea and vomiting.  Genitourinary: Negative.   Musculoskeletal:  Negative for joint pain and myalgias.  Skin:  Negative for rash.  Neurological:  Negative for weakness.  Endo/Heme/Allergies: Negative.   Psychiatric/Behavioral: Negative.      Objective:   Vitals:   04/06/24 1045  BP: 122/74  Pulse: 86  SpO2: 97%  Weight: 169 lb (76.7 kg)  Height: 5\' 5"  (1.651 m)    Physical Exam Constitutional:      General: She is not in acute distress.    Appearance: She is not ill-appearing.  HENT:     Head: Normocephalic and atraumatic.  Eyes:     General: No scleral icterus.    Conjunctiva/sclera: Conjunctivae normal.  Cardiovascular:     Rate and Rhythm: Normal rate and regular rhythm.     Pulses: Normal pulses.     Heart sounds: Normal heart sounds. No murmur heard. Pulmonary:     Effort: Pulmonary effort is normal.     Breath sounds: Normal breath sounds. No wheezing, rhonchi or rales.  Musculoskeletal:     Right lower leg: No edema.     Left lower leg: No edema.  Neurological:     Mental Status: She is alert.    CBC    Component Value Date/Time   WBC 8.2 02/19/2022 1146   RBC 4.36 02/19/2022 1146   HGB 13.3 02/19/2022 1146   HCT 39.7 02/19/2022 1146   PLT 207.0 02/19/2022 1146   MCV 91.0 02/19/2022 1146   MCH 29.3 03/11/2016 1735  MCHC 33.5 02/19/2022 1146   RDW 13.8  02/19/2022 1146   LYMPHSABS 2.0 02/19/2022 1146   MONOABS 0.7 02/19/2022 1146   EOSABS 0.4 02/19/2022 1146   BASOSABS 0.0 02/19/2022 1146      Latest Ref Rng & Units 02/19/2022   11:46 AM 11/20/2020   10:49 AM 11/11/2016    3:56 PM  BMP  Glucose 70 - 99 mg/dL 94  91  409   BUN 6 - 23 mg/dL 20  20  18    Creatinine 0.40 - 1.20 mg/dL 8.11  9.14  7.82   Sodium 135 - 145 mEq/L 137  138  139   Potassium 3.5 - 5.1 mEq/L 4.3  4.0  3.9   Chloride 96 - 112 mEq/L 104  104  105   CO2 19 - 32 mEq/L 27  25  25    Calcium  8.4 - 10.5 mg/dL 9.2  9.1  8.9    Chest imaging: HRCT Chest 10/24/22 1. The appearance of the lungs is very similar to the prior examination, with what appear to be scattered areas of post infectious or inflammatory scarring. No progression of findings compared to the prior examination and no definitive imaging findings to clearly indicate interstitial lung disease. 2. Aortic atherosclerosis, in addition to left anterior descending, left circumflex and right coronary artery disease. Please note that although the presence of coronary artery calcium  documents the presence of coronary artery disease, the severity of this disease and any potential stenosis cannot be assessed on this non-gated CT examination. Assessment for potential risk factor modification, dietary therapy or pharmacologic therapy may be warranted, if clinically indicated.  HRCT Chest 10/29/21 Mediastinum/Nodes: No pathologically enlarged mediastinal or hilar lymph nodes. Please note that accurate exclusion of hilar adenopathy is limited on noncontrast CT scans. Esophagus is unremarkable in appearance. No axillary lymphadenopathy.   Lungs/Pleura: High-resolution images demonstrates some scattered areas of mild cylindrical bronchiectasis with associated thickening of the peribronchovascular interstitium and regional architectural distortion and volume loss, most evident in the medial segment of the right  middle lobe, medial aspect of the right upper lobe, inferior segment of the left upper lobe and anterior aspect of the left lower lobe. No generalized areas of ground-glass attenuation, septal thickening or honeycombing are otherwise noted. Inspiratory and expiratory imaging demonstrates some mild air trapping indicative of small airways disease. No acute consolidative airspace disease. No pleural effusions. No definite suspicious appearing pulmonary nodules or masses are noted.   CXR 10/08/21 Increased interstitial markings bilaterally. No effusion or infiltrates noted.  CT Abdomen Pelvis 11/22/21 Lower lung fields unremarkable  PFT:    Latest Ref Rng & Units 10/10/2021    2:53 PM  PFT Results  FVC-Pre L 2.85   FVC-Predicted Pre % 105   FVC-Post L 2.87   FVC-Predicted Post % 106   Pre FEV1/FVC % % 81   Post FEV1/FCV % % 83   FEV1-Pre L 2.31   FEV1-Predicted Pre % 114   FEV1-Post L 2.38   DLCO uncorrected ml/min/mmHg 13.42   DLCO UNC% % 70   DLCO corrected ml/min/mmHg 13.42   DLCO COR %Predicted % 70   DLVA Predicted % 73   TLC L 5.57   TLC % Predicted % 110   RV % Predicted % 105   Mild diffusion defect  Labs:  Path:  Echo 03/29/21: LV EF 60-65%. LV diastolic function shows grade I diastolic dysfunction. RV systolic function is normal. RV size is normal. PASP . Left atrial size is  mildly dilated. Small pericardial effusion is present anteriorly.  NM Myocardial Stress Test 03/29/2021: No diagnostic ST segment changes to indicate ischemia. No arrhythmias. No significant myocardial perfusion defects on stress imaging to indicate ischemia. There is evidence of soft tissue attenuation affecting the inferior septal wall on rest imaging, also radiotracer uptake within the gut. This is a low risk study. Nuclear stress EF: 69%.  Assessment & Plan:   Bronchiectasis without complication (HCC) - Plan: fluticasone -salmeterol (ADVAIR HFA) 115-21 MCG/ACT inhaler, albuterol   (PROVENTIL  HFA) 108 (90 Base) MCG/ACT inhaler, ipratropium-albuterol  (DUONEB) 0.5-2.5 (3) MG/3ML SOLN  Discussion: Cathy [name] is a 83 year old woman, never smoker with history of asthma, GERD and allergic rhinitis who returns to pulmonary clinic for follow up of bronchiectasis.   She has mild scattered areas of bronchiectasis. She is to continue advair HFA 115-21mcg 2 puffs twice daily as needed.    She is to continue duoneb nebulizer treatments 2-3 times per day as needed followed by flutter valve for airway clearance.    Her bronchiectasis is possibly secondary to chronic reflux. She is to sleep with the head of her bed elevated to prevent nocturnal reflux issues.  Follow up in 1 year  Duaine German, MD [facility] Office: [phone]    Current Outpatient Medications:    atorvastatin  (LIPITOR) 20 MG tablet, TAKE 1 TABLET BY MOUTH EVERY DAY *INCREASED DOSE*, Disp: 90 tablet, Rfl: 1   azelastine  (ASTELIN ) 0.1 % nasal spray, Place 1 spray into both nostrils at bedtime., Disp: , Rfl: 11   carvedilol (COREG) 6.25 MG tablet, Take 1 tablet by mouth 2 (two) times daily with a meal., Disp: , Rfl:    cetirizine (ZYRTEC) 10 MG tablet, Take 10 mg by mouth daily., Disp: , Rfl:    levothyroxine (SYNTHROID) 50 MCG tablet, Take 50 mcg by mouth., Disp: , Rfl:    meloxicam (MOBIC) 15 MG tablet, Take 15 mg by mouth daily., Disp: , Rfl:    polyethylene glycol powder (GLYCOLAX/MIRALAX) 17 GM/SCOOP powder, Take 1 Container by mouth as needed., Disp: , Rfl:    RABEprazole  (ACIPHEX ) 20 MG tablet, Take 1 tablet (20 mg total) by mouth in the morning and at bedtime., Disp: 60 tablet, Rfl: 1   Vitamin D, Ergocalciferol, (DRISDOL) 50000 units CAPS capsule, Take 1 capsule by mouth once a week., Disp: , Rfl:    albuterol  (PROVENTIL  HFA) 108 (90 Base) MCG/ACT inhaler, Inhale 1-2 puffs into the lungs every 6 (six) hours as needed for wheezing or shortness of breath., Disp: 1 each, Rfl:  6   fluticasone -salmeterol (ADVAIR HFA) 115-21 MCG/ACT inhaler, Inhale 2 puffs into the lungs 2 (two) times daily., Disp: 1 each, Rfl: 11   ipratropium-albuterol  (DUONEB) 0.5-2.5 (3) MG/3ML SOLN, Take 3 mLs by nebulization every 6 (six) hours as needed., Disp: 180 mL, Rfl: 11   pantoprazole  (PROTONIX ) 40 MG tablet, TAKE 1 TABLET BY MOUTH TWICE A DAY, Disp: 180 tablet, Rfl: 1

## 2024-04-08 ENCOUNTER — Other Ambulatory Visit: Payer: Self-pay

## 2024-04-08 MED ORDER — RABEPRAZOLE SODIUM 20 MG PO TBEC: 20.0000 mg | DELAYED_RELEASE_TABLET | Freq: Every day | ORAL | 1 refills | Status: DC

## 2024-04-08 NOTE — Telephone Encounter (Signed)
 Mylinda Asa, can you help here please It looks like the Aciphex  is being questioned but according to your last note this is what her insurance company required before vonoprazan could be tried or approved [employer]

## 2024-05-02 ENCOUNTER — Other Ambulatory Visit: Payer: Self-pay | Admitting: Internal Medicine

## 2024-06-01 ENCOUNTER — Other Ambulatory Visit: Payer: Self-pay | Admitting: Cardiology

## 2024-07-04 ENCOUNTER — Encounter: Payer: Self-pay | Admitting: Internal Medicine

## 2024-08-19 ENCOUNTER — Ambulatory Visit: Admitting: Nurse Practitioner

## 2024-08-19 ENCOUNTER — Encounter: Payer: Self-pay | Admitting: Nurse Practitioner

## 2024-08-19 VITALS — BP 126/76 | HR 76 | Ht 65.5 in | Wt 179.2 lb

## 2024-08-19 DIAGNOSIS — R609 Edema, unspecified: Secondary | ICD-10-CM | POA: Diagnosis not present

## 2024-08-19 DIAGNOSIS — K2100 Gastro-esophageal reflux disease with esophagitis, without bleeding: Secondary | ICD-10-CM

## 2024-08-19 DIAGNOSIS — K449 Diaphragmatic hernia without obstruction or gangrene: Secondary | ICD-10-CM

## 2024-08-19 DIAGNOSIS — K219 Gastro-esophageal reflux disease without esophagitis: Secondary | ICD-10-CM

## 2024-08-19 MED ORDER — FAMOTIDINE 20 MG PO TABS: 20.0000 mg | ORAL_TABLET | Freq: Every day | ORAL | 1 refills | Status: DC

## 2024-08-19 NOTE — Progress Notes (Signed)
 08/19/2024 [name] [id number] Dec 20, 1940   Chief Complaint: Follow-up after EGD and colonoscopy  History of Present Illness: Cathy [name]. Halvorsen is an 83 year old female with a past medical history of arthritis, osteoporosis, hyperlipidemia, paroxysmal supraventricular tachycardia, asthma, hiatal hernia, GERD, H. pylori, and colon polyps. She is known by Dr. Albertus.  Her reflux symptoms were not well-controlled on Pantoprazole  40 mg twice daily.  She underwent an EGD 03/10/2024 which showed grade B reflux esophagitis and a 2 cm hiatal hernia and the stomach was normal and biopsies were negative for H. pylori. Voquenza 20mg  every day was ordered, but was not covered by her insurance therefore she was started on Rabeprazole  20mg  bid. The colonoscopy was done on the same date which showed mild diverticulosis in the transverse, descending and transverse colon without colon polyps. No further colonoscopies were recommended due to age.  She presented today for GERD follow-up. She was started on Rabeprazole  20 mg bid post EGD as noted above.She noted having easy bruising within 2 to 3 weeks after she initially started Rabeprazole  so she stopped taking it for 2 few weeks and took Protonix  bid which was ineffective. Her bruising resolved therefore she elected to restart Rabeprazole  20mg  once daily a few weeks ago without any further bruising and her reflux symptoms improved but did not completely abate. She describes having active burning reflux with associated mid chest pain 3 times weekly which occurs 1 to 3 hours after eating. She describes the chest pain as burning and stabbing. No associated nausea or vomiting. No shortness of breath or dizziness. Her esophageal burning and chest pain lasts for 30 to 60 minutes then abates. She sometimes takes Tums as needed without notable relief. No bloody or black stools. She remains active and tries to walk 7,500 steps daily. No chest pain with activity. On  exam today, she has 1+ nearly pitting edema to her lower extremities which she stated is new and triggered after sitting in a car for 5 hours yesterday driving back from the beach.     Latest Ref Rng & Units 02/19/2022   11:46 AM 11/20/2020   10:49 AM 03/11/2016    5:35 PM  CBC  WBC 4.0 - 10.5 K/uL 8.2  8.3  11.5   Hemoglobin 12.0 - 15.0 g/dL 86.6  86.5  89.2   Hematocrit 36.0 - 46.0 % 39.7  39.6  32.8   Platelets 150.0 - 400.0 K/uL 207.0  200.0  256        Latest Ref Rng & Units 02/19/2022   11:46 AM 11/20/2020   10:49 AM 11/11/2016    3:56 PM  CMP  Glucose 70 - 99 mg/dL 94  91  873   BUN 6 - 23 mg/dL 20  20  18    Creatinine 0.40 - 1.20 mg/dL 9.05  9.12  9.02   Sodium 135 - 145 mEq/L 137  138  139   Potassium 3.5 - 5.1 mEq/L 4.3  4.0  3.9   Chloride 96 - 112 mEq/L 104  104  105   CO2 19 - 32 mEq/L 27  25  25    Calcium  8.4 - 10.5 mg/dL 9.2  9.1  8.9   Total Protein 6.0 - 8.3 g/dL 6.7  7.1    Total Bilirubin 0.2 - 1.2 mg/dL 0.6  0.5    Alkaline Phos 39 - 117 U/L 91  91    AST 0 - 37 U/L 19  21    ALT  0 - 35 U/L 12  10      GI PROCEDURES:  EGD 03/10/2024: - LA Grade B reflux esophagitis with no bleeding.  - 2 cm hiatal hernia.  - Normal stomach. Biopsied.  - Normal examined duodenum.   IColonoscopy 03/10/2024: - Mild diverticulosis in the sigmoid colon, in the descending colon and in the transverse colon. - The examination was otherwise normal on direct and retroflexion views.  - No specimens collected.  EGD 11/2020:  - Normal esophagus. - 2 cm hiatal hernia. - Erythematous mucosa in the gastric fundus. Biopsied to evaluate for H. Pylori infection. - Normal gastric body, incisura and antrum. Biopsied to evaluate for H. Pylori infection. - Normal examined duodenum. 1. Surgical [P], gastric antrum and gastric body :       - GASTRIC ANTRAL AND OXYNTIC MUCOSA WITH NO SPECIFIC PATHOLOGIC CHANGES       - NEGATIVE FOR H. PYLORI ON H&E STAIN       - NEGATIVE FOR INTESTINAL  METAPLASIA, DYSPLASIA OR MALIGNANCY    1. Surgical [P], gastric antrum - ANTRAL MUCOSA WITH MILDLY ACTIVE CHRONIC HELICOBACTER PYLORI GASTRITIS. - WARTHIN-STARRY POSITIVE FOR HELICOBACTER PYLORI. - NO INTESTINAL METAPLASIA, DYSPLASIA OR CARCINOMA. 2. Surgical [P], stomach, fundus, gastric body - OXYNTIC MUCOSA WITH MILDLY ACTIVE CHRONIC HELICOBACTER PYLORI GASTRITIS. - WARTHIN-STARRY POSITIVE FOR HELICOBACTER PYLORI. - NO INTESTINAL METAPLASIA, DYSPLASIA OR CARCINOMA.   Treated for H. pylori with Talicia  and then had a negative breath test following treatment 03/2021.   Colonoscopy 02/2018: Showed a transverse 5 mm polyp found to be adenomatous.  Scattered diverticulosis and internal hemorrhoids.  Current Outpatient Medications on File Prior to Visit  Medication Sig Dispense Refill   albuterol  (PROVENTIL  HFA) 108 (90 Base) MCG/ACT inhaler Inhale 1-2 puffs into the lungs every 6 (six) hours as needed for wheezing or shortness of breath. 1 each 6   atorvastatin  (LIPITOR) 20 MG tablet TAKE 1 TABLET BY MOUTH EVERY DAY *INCREASED DOSE* 90 tablet 2   azelastine  (ASTELIN ) 0.1 % nasal spray Place 1 spray into both nostrils at bedtime.  11   azithromycin (ZITHROMAX) 250 MG tablet Take 250 mg by mouth daily.     carvedilol (COREG) 6.25 MG tablet Take 1 tablet by mouth 2 (two) times daily with a meal.     cetirizine (ZYRTEC) 10 MG tablet Take 10 mg by mouth daily.     fluticasone -salmeterol (ADVAIR HFA) 115-21 MCG/ACT inhaler Inhale 2 puffs into the lungs 2 (two) times daily. 1 each 11   ipratropium-albuterol  (DUONEB) 0.5-2.5 (3) MG/3ML SOLN Take 3 mLs by nebulization every 6 (six) hours as needed. 180 mL 11   levothyroxine (SYNTHROID) 50 MCG tablet Take 50 mcg by mouth.     meloxicam (MOBIC) 15 MG tablet Take 15 mg by mouth daily.     polyethylene glycol powder (GLYCOLAX/MIRALAX) 17 GM/SCOOP powder Take 1 Container by mouth as needed.     RABEprazole  (ACIPHEX ) 20 MG tablet TAKE 1 TABLET BY MOUTH  EVERY DAY 90 tablet 1   Vitamin D, Ergocalciferol, (DRISDOL) 50000 units CAPS capsule Take 1 capsule by mouth once a week.     No current facility-administered medications on file prior to visit.   Allergies  Allergen Reactions   Ciprofloxacin Other (See Comments)    bothered achilles tendon   Keflex  [Cephalexin ] Hives   Penicillins Rash   Sulfonamide Derivatives Rash   Current Medications, Allergies, Past Medical History, Past Surgical History, Family History and Social History were reviewed in [facility] electronic medical record.  Review of Systems:   Constitutional: Negative for fever, sweats, chills or weight loss.  Respiratory: Negative for shortness of breath.   Cardiovascular: See HPI. Gastrointestinal: See HPI.  Musculoskeletal: Negative for back pain or muscle aches.  Neurological: Negative for dizziness, headaches or paresthesias.   Physical Exam: BP 126/76   Pulse 76   Ht 5' 5.5 (1.664 m)   Wt 179 lb 4 oz (81.3 kg)   BMI 29.38 kg/m   General: 83 year old female in no acute distress. Head: Normocephalic and atraumatic. Eyes: No scleral icterus. Conjunctiva pink . Ears: Normal auditory acuity. Mouth: Dentition intact. No ulcers or lesions.  Lungs: Clear throughout to auscultation. Heart: Regular rate and rhythm, no murmur. Abdomen: Soft, nontender and nondistended. No masses or hepatomegaly. Normal bowel sounds x 4 quadrants.  Rectal: Deferred. Musculoskeletal: Symmetrical with no gross deformities. Extremities: Mild 1+ nearly pitting edema to the lower extremities. Neurological: Alert oriented x 4. No focal deficits.  Psychological: Alert and cooperative. Normal mood and affect  Assessment and Recommendations:  GERD, hiatal hernia. EGD 03/10/2024 which showed grade B reflux esophagitis and a 2 cm hiatal hernia, stomach was normal and biopsies were negative for H. pylori.  Started on Rabeprazole20 mg twice daily post EGD which she stopped for few weeks  after she developed new bruises. She went back on Protonix  twice daily which was ineffective regarding her reflux symptoms therefore she elected to restart Rabeprazole  20 mg once daily a few weeks ago without recurrence of bruising. Since then, she feels her heartburn has improved but she continues to have episodes of active acid reflux with associated mid chest burning and stabbing pain. - Continue Rabeprazole  20mg  every day - Add Famotidine  20 mg 1 tab p.o. nightly - TUMS one tab every day PRN - Continue GERD diet - Patient to follow-up with cardiologist  Dr. Jayson Sierras to rule out cardiac etiology for her chest pain - Patient to provide symptom update in 2 to 3 weeks  New onset lower extremity edema, occurred after driving in the car for 5 hours started after returning from the beach - Patient instructed to contact her cardiologist as noted above, to also address lower extremity edema if persists - Patient instructed to maintain a low-sodium diet and to keep her legs elevated when sitting  Today's encounter was 25 minutes which included precharting, chart/result review, history/exam, face-to-face time used for counseling, formulating a treatment plan with follow-up and documentation.

## 2024-08-19 NOTE — Patient Instructions (Signed)
 Follow-up with Cardiologist regarding Leg Swelling and Chest Pain.   Continue Rabeprazole  20 mg 1 capsule by mouth 30 min before lunch.   We have sent the following medications to your pharmacy for you to pick up at your convenience: Famotidine   Avoid Spicy/ Acidic Foods   Use Tums once daily as needed.   _______________________________________________________  If your blood pressure at your visit was 140/90 or greater, please contact your primary care physician to follow up on this.  _______________________________________________________  If you are age 79 or older, your body mass index should be between 23-30. Your Body mass index is 29.38 kg/m. If this is out of the aforementioned range listed, please consider follow up with your Primary Care Provider.  If you are age 3 or younger, your body mass index should be between 19-25. Your Body mass index is 29.38 kg/m. If this is out of the aformentioned range listed, please consider follow up with your Primary Care Provider.   ________________________________________________________  The [facility] providers would like to encourage you to use MYCHART to communicate with providers for non-urgent requests or questions.  Due to long hold times on the telephone, sending your provider a message by [facility] may be a faster and more efficient way to get a response.  Please allow 48 business hours for a response.  Please remember that this is for non-urgent requests.  _______________________________________________________  [facility] is using a team-based approach to care.  Your team is made up of your doctor and two to three APPS. Our APPS (Nurse Practitioners and Physician Assistants) work with your physician to ensure care continuity for you. They are fully qualified to address your health concerns and develop a treatment plan. They communicate directly with your gastroenterologist to care for you. Seeing the Advanced Practice  Practitioners on your physician's team can help you by facilitating care more promptly, often allowing for earlier appointments, access to diagnostic testing, procedures, and other specialty referrals.   Thank you for trusting me with your gastrointestinal care!   Elida Shawl, CRNP

## 2024-09-13 ENCOUNTER — Other Ambulatory Visit: Payer: Self-pay | Admitting: Nurse Practitioner

## 2024-09-14 ENCOUNTER — Ambulatory Visit: Payer: Self-pay | Admitting: Cardiology

## 2024-09-19 ENCOUNTER — Encounter: Payer: Self-pay | Admitting: Cardiology

## 2024-09-19 ENCOUNTER — Ambulatory Visit: Attending: Cardiology | Admitting: Cardiology

## 2024-09-19 VITALS — BP 126/64 | HR 77 | Ht 65.5 in | Wt 169.6 lb

## 2024-09-19 DIAGNOSIS — E782 Mixed hyperlipidemia: Secondary | ICD-10-CM

## 2024-09-19 DIAGNOSIS — I471 Supraventricular tachycardia, unspecified: Secondary | ICD-10-CM

## 2024-09-19 DIAGNOSIS — I4719 Other supraventricular tachycardia: Secondary | ICD-10-CM | POA: Diagnosis not present

## 2024-09-19 DIAGNOSIS — I700 Atherosclerosis of aorta: Secondary | ICD-10-CM

## 2024-09-19 DIAGNOSIS — R002 Palpitations: Secondary | ICD-10-CM

## 2024-09-19 MED ORDER — ATORVASTATIN CALCIUM 40 MG PO TABS: 40.0000 mg | ORAL_TABLET | Freq: Every day | ORAL | 3 refills | Status: AC

## 2024-09-19 NOTE — Patient Instructions (Addendum)

## 2024-09-19 NOTE — Progress Notes (Signed)
    Cardiology Office Note  Date: 09/19/2024   ID: [name], [name] 1940-12-15, MRN [id number]  History of Present Illness: Cathy [name] is an 82 y.o. female last seen in February.  She is here for a routine visit.  Reports no interval increase in palpitations, no dizziness or syncope.  We discussed the results of her cardiac monitor from March.  I went over her medications.  No plan to change beta-blocker at this time given stable symptoms.  She has been on Lipitor 40 mg daily with improvement in LDL down to 82.  I reviewed her ECG today which shows sinus rhythm with poor R wave progression as before.  Physical Exam: VS:  BP 126/64   Pulse 77   Ht 5' 5.5 (1.664 m)   Wt 169 lb 9.6 oz (76.9 kg)   SpO2 98%   BMI 27.79 kg/m , BMI Body mass index is 27.79 kg/m.  Wt Readings from Last 3 Encounters:  09/19/24 169 lb 9.6 oz (76.9 kg)  08/19/24 179 lb 4 oz (81.3 kg)  04/06/24 169 lb (76.7 kg)    General: Patient appears comfortable at rest. HEENT: Conjunctiva and lids normal. Neck: Supple, no elevated JVP or carotid bruits. Lungs: Clear to auscultation, nonlabored breathing at rest. Cardiac: Regular rate and rhythm, no S3, 1/6 systolic murmur. Extremities: No pitting edema.  ECG:  An ECG dated 12/05/2022 was personally reviewed today and demonstrated:  Sinus rhythm with IVCD and poor R wave progression.  Labwork:  May 2025: Hemoglobin A1c 5.9%, hemoglobin 12.3, platelets 195, potassium 4.2, BUN 26, creatinine 1.05, GFR 52, AST 26, ALT 16, TSH 2.126 September 2025: Cholesterol 168, triglycerides 37, HDL 80, LDL 82  Other Studies Reviewed Today:  Cardiac monitor March 2025: ZIO monitor reviewed.  6 days, 20 hours analyzed.   Predominant rhythm is sinus with IVCD, heart rate ranging from 51 bpm up to 147 bpm and average heart rate 75 bpm. There were occasional PACs representing 1.1% total beats with otherwise rare atrial couplets and triplets. There were rare  PVCs representing less than 1% total beats. There were multiple (171) episodes of PSVT, the longest of which lasted 26.5 seconds. No pauses or high degree heart block.  Assessment and Plan:  1.  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  No increasing palpitations on current regimen.  Cardiac monitor from March reviewed.  Plan to continue Coreg 6.25 mg twice daily for now.   2.  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  Lexiscan  Myoview  in April 2022 showed no evidence of ischemia to suggest obstructive CAD and LVEF 69%.  She reports no angina.  Continue statin therapy.   3.  Mixed hyperlipidemia.  LDL has come down to 82 with HDL 80 on Lipitor 40 mg daily.  No changes made today.  Disposition:  Follow up 1 year.  Signed, Jayson JUDITHANN Sierras, M.D., F.A.C.C. [facility]

## 2024-09-23 ENCOUNTER — Other Ambulatory Visit: Payer: Self-pay | Admitting: Internal Medicine

## 2024-09-23 DIAGNOSIS — Z1231 Encounter for screening mammogram for malignant neoplasm of breast: Secondary | ICD-10-CM

## 2024-10-20 ENCOUNTER — Ambulatory Visit

## 2024-10-21 ENCOUNTER — Ambulatory Visit
Admission: RE | Admit: 2024-10-21 | Discharge: 2024-10-21 | Disposition: A | Discharge status: Home / Self Care | Source: Ambulatory Visit | Attending: Internal Medicine | Admitting: Internal Medicine

## 2024-10-21 DIAGNOSIS — Z1231 Encounter for screening mammogram for malignant neoplasm of breast: Secondary | ICD-10-CM

## 2024-10-26 ENCOUNTER — Other Ambulatory Visit: Payer: Self-pay | Admitting: Internal Medicine

## 2024-10-26 DIAGNOSIS — R928 Other abnormal and inconclusive findings on diagnostic imaging of breast: Secondary | ICD-10-CM

## 2024-11-09 ENCOUNTER — Ambulatory Visit

## 2024-11-09 ENCOUNTER — Ambulatory Visit
Admission: RE | Admit: 2024-11-09 | Discharge: 2024-11-09 | Disposition: A | Source: Ambulatory Visit | Attending: Internal Medicine | Admitting: Internal Medicine

## 2024-11-09 DIAGNOSIS — R928 Other abnormal and inconclusive findings on diagnostic imaging of breast: Secondary | ICD-10-CM

## 2024-11-15 NOTE — Progress Notes (Unsigned)
    [name] [address] [place] [address] Phone: [phone]   Assessment and Plan:     ***    Pertinent previous records reviewed include ***   Follow Up: ***     Subjective:   I, Cathy [name], am serving as a neurosurgeon for Doctor Morene Mace  Chief Complaint: left wrist pain   HPI:   11/16/2024 Patient is a 83 year old female with left wrist pain. Patient states   Relevant Historical Information: ***  Additional pertinent review of systems negative.   Current Outpatient Medications:    albuterol  (PROVENTIL  HFA) 108 (90 Base) MCG/ACT inhaler, Inhale 1-2 puffs into the lungs every 6 (six) hours as needed for wheezing or shortness of breath., Disp: 1 each, Rfl: 6   atorvastatin  (LIPITOR) 40 MG tablet, Take 1 tablet (40 mg total) by mouth daily., Disp: 90 tablet, Rfl: 3   carvedilol (COREG) 6.25 MG tablet, Take 1 tablet by mouth 2 (two) times daily with a meal., Disp: , Rfl:    cetirizine (ZYRTEC) 10 MG tablet, Take 10 mg by mouth daily., Disp: , Rfl:    famotidine  (PEPCID ) 20 MG tablet, TAKE 1 TABLET (20 MG TOTAL) BY MOUTH DAILY BEFORE LUNCH., Disp: 90 tablet, Rfl: 1   fluticasone -salmeterol (ADVAIR HFA) 115-21 MCG/ACT inhaler, Inhale 2 puffs into the lungs 2 (two) times daily., Disp: 1 each, Rfl: 11   ipratropium-albuterol  (DUONEB) 0.5-2.5 (3) MG/3ML SOLN, Take 3 mLs by nebulization every 6 (six) hours as needed., Disp: 180 mL, Rfl: 11   levothyroxine (SYNTHROID) 50 MCG tablet, Take 50 mcg by mouth., Disp: , Rfl:    meloxicam (MOBIC) 15 MG tablet, Take 15 mg by mouth daily., Disp: , Rfl:    polyethylene glycol powder (GLYCOLAX/MIRALAX) 17 GM/SCOOP powder, Take 1 Container by mouth as needed., Disp: , Rfl:    RABEprazole  (ACIPHEX ) 20 MG tablet, TAKE 1 TABLET BY MOUTH EVERY DAY, Disp: 90 tablet, Rfl: 1   Vitamin D, Ergocalciferol, (DRISDOL) 50000 units CAPS capsule, Take 1 capsule by mouth once a week., Disp:  , Rfl:    Objective:     There were no vitals filed for this visit.    There is no height or weight on file to calculate BMI.    Physical Exam:    ***   Electronically signed by:  Odis Mace D.[name] 9:31 AM 11/15/24

## 2024-11-16 ENCOUNTER — Ambulatory Visit: Admitting: Sports Medicine

## 2024-11-16 ENCOUNTER — Ambulatory Visit

## 2024-11-16 VITALS — HR 68 | Ht 65.0 in | Wt 169.0 lb

## 2024-11-16 DIAGNOSIS — M654 Radial styloid tenosynovitis [de Quervain]: Secondary | ICD-10-CM | POA: Diagnosis not present

## 2024-11-16 DIAGNOSIS — M25532 Pain in left wrist: Secondary | ICD-10-CM

## 2024-11-16 MED ORDER — MELOXICAM 15 MG PO TABS: 15.0000 mg | ORAL_TABLET | Freq: Every day | ORAL | Status: AC | PRN

## 2024-11-16 MED ORDER — METHYLPREDNISOLONE 4 MG PO TBPK: ORAL_TABLET | ORAL | 0 refills | Status: AC

## 2024-11-16 NOTE — Patient Instructions (Addendum)
 Thumb HEP   Continue to use wist brace when active   Prednisone dos pak   - Use meloxicam 15 mg daily as needed for breakthrough pain.  Recommend limiting chronic NSAIDs to 1-2 doses per week to prevent long-term side effects. Use Tylenol  500 to 1000 mg tablets 2-3 times a day as needed for day-to-day pain relief.    3-4 wee follow up

## 2024-11-22 ENCOUNTER — Ambulatory Visit: Payer: Self-pay | Admitting: Sports Medicine

## 2024-12-06 NOTE — Progress Notes (Signed)
 "               Odis Mace D.CLEMENTEEN AMYE [facility] [address] [place] [address] Phone: [phone]   Assessment and Plan:     1. Left wrist pain (Primary) 2. De Quervain's tenosynovitis, left -Subacute, minimal improvement, subsequent visit - Continued radial sided wrist pain.  Likely sprain of radial sided wrist ligaments versus DeQuervain's Tenosynovitis occurring from patient pushing a heavy box 2 months ago - Continue HEP for DeQuervain's Tenosynovitis - Continue thumb spica wrist brace when physically active - Discontinue prednisone - Patient had been using meloxicam  15 mg daily for years.  Recommend changing chronic use to prevent long-term side effects.  Use meloxicam  15 mg daily as needed for breakthrough pain.  Recommend limiting chronic NSAIDs to 1-2 doses per week to prevent long-term side effects. Use Tylenol  500 to 1000 mg tablets 2-3 times a day as needed for day-to-day pain relief.    -Patient elected for CSI to radial sided wrist.  Tolerated well per note below  Procedure: Ultrasound Guided De Quervain Tendon Sheath Injection Side: Left Diagnosis: Radial sided wrist pain US  Indication:  - accuracy is paramount for diagnosis - to ensure therapeutic efficacy or procedural success - to reduce procedural risk  After explaining the procedure, viable alternatives, risks, and answering any questions, consent was given verbally. The site was cleaned with chlorhexidine  prep. An ultrasound transducer was placed on the dorsal wrist.  The 1st dorsal wrist compartment was identified.  There was fluid surrounding the tendons and within the sheath. The tendons were Normal.  A steroid injection was performed under ultrasound guidance using  1ml of 1% lidocaine  without epinephrine  and 20 mg of Kenalog 40. This was well tolerated and resulted in  relief.  Needle was removed and dressing placed and post injection instructions were given including  a discussion of likely  return of pain today after the anesthetic wears off (with the possibility of worsened pain) until the steroid starts to work in 1-3 days.   Pt was advised to call or return to clinic if these symptoms worsen or fail to improve as anticipated. Images permanently stored.    Pertinent previous records reviewed include none   Follow Up: 4 to 6 weeks for reevaluation.  Could consider PT/OT versus advanced imaging   Subjective:   I, Moenique Parris, am serving as a neurosurgeon for Doctor Morene Mace   Chief Complaint: left wrist pain    HPI:    11/16/2024 Patient is a 83 year old female with left wrist pain. Patient states she pushed a heavy box. She was given a brace. Injury happened a couple of weeks ago. Tylenol  does not help. No numbness or tingling. Decreased ROM. Grip strength intact . Pain is increased at night. Pain starts thumb and up the wrist    12/12/2024 Patient states prednisone helped a little but she still has pain   Relevant Historical Information: GERD  Additional pertinent review of systems negative.  Current Medications[1]   Objective:     Vitals:   12/12/24 1330  Weight: 170 lb (77.1 kg)  Height: 5' 5 (1.651 m)      Body mass index is 28.29 kg/m.    Physical Exam:    General: Appears well, nad, nontoxic and pleasant Neuro:sensation intact, strength is 5/5 in upper extremities, muscle tone wnl Skin:no susupicious lesions or rashes   Left hand/Wrist:   No deformity or swelling appreciated. ROM  Ext 90,  flexion 70, radial/ulnar deviation 30 TTP radial styloid, lateral forearm, first MTP, CMC nttp over the snuff box, dorsal carpals, volar carpals,   ulnar styloid,   tfcc Negative Tinel's, Phalen's, Prayer Tests Positive finklestein, though less painful compared with prior office visit Neg tfcc bounce test No pain with resisted ext, flex or deviation    Electronically signed by:  Odis Mace D.CLEMENTEEN [facility] 1:41 PM 12/12/2024      [1]  Current Outpatient Medications:    albuterol  (PROVENTIL  HFA) 108 (90 Base) MCG/ACT inhaler, Inhale 1-2 puffs into the lungs every 6 (six) hours as needed for wheezing or shortness of breath., Disp: 1 each, Rfl: 6   atorvastatin  (LIPITOR) 40 MG tablet, Take 1 tablet (40 mg total) by mouth daily., Disp: 90 tablet, Rfl: 3   carvedilol (COREG) 6.25 MG tablet, Take 1 tablet by mouth 2 (two) times daily with a meal., Disp: , Rfl:    cetirizine (ZYRTEC) 10 MG tablet, Take 10 mg by mouth daily., Disp: , Rfl:    famotidine  (PEPCID ) 20 MG tablet, TAKE 1 TABLET (20 MG TOTAL) BY MOUTH DAILY BEFORE LUNCH., Disp: 90 tablet, Rfl: 1   fluticasone -salmeterol (ADVAIR HFA) 115-21 MCG/ACT inhaler, Inhale 2 puffs into the lungs 2 (two) times daily., Disp: 1 each, Rfl: 11   ipratropium-albuterol  (DUONEB) 0.5-2.5 (3) MG/3ML SOLN, Take 3 mLs by nebulization every 6 (six) hours as needed., Disp: 180 mL, Rfl: 11   levothyroxine (SYNTHROID) 50 MCG tablet, Take 50 mcg by mouth., Disp: , Rfl:    meloxicam  (MOBIC ) 15 MG tablet, Take 1 tablet (15 mg total) by mouth daily as needed for pain., Disp: , Rfl:    methylPREDNISolone  (MEDROL  DOSEPAK) 4 MG TBPK tablet, Take 6 tablets on day 1.  Take 5 tablets on day 2.  Take 4 tablets on day 3.  Take 3 tablets on day 4.  Take 2 tablets on day 5.  Take 1 tablet on day 6., Disp: 21 tablet, Rfl: 0   polyethylene glycol powder (GLYCOLAX/MIRALAX) 17 GM/SCOOP powder, Take 1 Container by mouth as needed., Disp: , Rfl:    RABEprazole  (ACIPHEX ) 20 MG tablet, TAKE 1 TABLET BY MOUTH EVERY DAY, Disp: 90 tablet, Rfl: 1   Vitamin D, Ergocalciferol, (DRISDOL) 50000 units CAPS capsule, Take 1 capsule by mouth once a week., Disp: , Rfl:   "

## 2024-12-12 ENCOUNTER — Ambulatory Visit: Admitting: Sports Medicine

## 2024-12-12 ENCOUNTER — Other Ambulatory Visit: Payer: Self-pay

## 2024-12-12 VITALS — Ht 65.0 in | Wt 170.0 lb

## 2024-12-12 DIAGNOSIS — M25532 Pain in left wrist: Secondary | ICD-10-CM | POA: Diagnosis not present

## 2024-12-12 DIAGNOSIS — M654 Radial styloid tenosynovitis [de Quervain]: Secondary | ICD-10-CM

## 2024-12-12 NOTE — Patient Instructions (Signed)
 4 week follow up   Voltaren gel over areas of pain

## 2025-01-06 NOTE — Progress Notes (Unsigned)
"             ° °   [name] [address] [place] [address] Phone: [phone]   Assessment and Plan:     ***    Pertinent previous records reviewed include ***   Follow Up: ***     Subjective:   I, Cathy [name], am serving as a neurosurgeon for Doctor Morene Mace   Chief Complaint: left wrist pain    HPI:    11/16/2024 Patient is a 84 year old female with left wrist pain. Patient states she pushed a heavy box. She was given a brace. Injury happened a couple of weeks ago. Tylenol  does not help. No numbness or tingling. Decreased ROM. Grip strength intact . Pain is increased at night. Pain starts thumb and up the wrist    12/12/2024 Patient states prednisone helped a little but she still has pain  01/09/2025 Patient states   Relevant Historical Information: GERD  Additional pertinent review of systems negative.  Current Medications[1]   Objective:     There were no vitals filed for this visit.    There is no height or weight on file to calculate BMI.    Physical Exam:    ***   Electronically signed by:  Odis Mace D.CLEMENTEEN [name] 7:19 AM 01/06/25    [1]  Current Outpatient Medications:    albuterol  (PROVENTIL  HFA) 108 (90 Base) MCG/ACT inhaler, Inhale 1-2 puffs into the lungs every 6 (six) hours as needed for wheezing or shortness of breath., Disp: 1 each, Rfl: 6   atorvastatin  (LIPITOR) 40 MG tablet, Take 1 tablet (40 mg total) by mouth daily., Disp: 90 tablet, Rfl: 3   carvedilol (COREG) 6.25 MG tablet, Take 1 tablet by mouth 2 (two) times daily with a meal., Disp: , Rfl:    cetirizine (ZYRTEC) 10 MG tablet, Take 10 mg by mouth daily., Disp: , Rfl:    famotidine  (PEPCID ) 20 MG tablet, TAKE 1 TABLET (20 MG TOTAL) BY MOUTH DAILY BEFORE LUNCH., Disp: 90 tablet, Rfl: 1   fluticasone -salmeterol (ADVAIR HFA) 115-21 MCG/ACT inhaler, Inhale 2 puffs into the lungs 2 (two) times daily., Disp: 1 each,  Rfl: 11   ipratropium-albuterol  (DUONEB) 0.5-2.5 (3) MG/3ML SOLN, Take 3 mLs by nebulization every 6 (six) hours as needed., Disp: 180 mL, Rfl: 11   levothyroxine (SYNTHROID) 50 MCG tablet, Take 50 mcg by mouth., Disp: , Rfl:    meloxicam  (MOBIC ) 15 MG tablet, Take 1 tablet (15 mg total) by mouth daily as needed for pain., Disp: , Rfl:    methylPREDNISolone  (MEDROL  DOSEPAK) 4 MG TBPK tablet, Take 6 tablets on day 1.  Take 5 tablets on day 2.  Take 4 tablets on day 3.  Take 3 tablets on day 4.  Take 2 tablets on day 5.  Take 1 tablet on day 6., Disp: 21 tablet, Rfl: 0   polyethylene glycol powder (GLYCOLAX/MIRALAX) 17 GM/SCOOP powder, Take 1 Container by mouth as needed., Disp: , Rfl:    RABEprazole  (ACIPHEX ) 20 MG tablet, TAKE 1 TABLET BY MOUTH EVERY DAY, Disp: 90 tablet, Rfl: 1   Vitamin D, Ergocalciferol, (DRISDOL) 50000 units CAPS capsule, Take 1 capsule by mouth once a week., Disp: , Rfl:   "

## 2025-01-09 ENCOUNTER — Ambulatory Visit: Admitting: Sports Medicine

## 2025-01-10 ENCOUNTER — Ambulatory Visit: Admitting: Sports Medicine

## 2025-01-10 VITALS — BP 130/76 | HR 58 | Ht 65.0 in | Wt 169.0 lb

## 2025-01-10 DIAGNOSIS — M25532 Pain in left wrist: Secondary | ICD-10-CM | POA: Diagnosis not present

## 2025-01-10 DIAGNOSIS — M654 Radial styloid tenosynovitis [de Quervain]: Secondary | ICD-10-CM

## 2025-01-10 DIAGNOSIS — F40240 Claustrophobia: Secondary | ICD-10-CM

## 2025-01-10 MED ORDER — LORAZEPAM 0.5 MG PO TABS: ORAL_TABLET | ORAL | 0 refills | Status: AC

## 2025-01-10 NOTE — Progress Notes (Signed)
 "               Odis Mace D.CLEMENTEEN AM[facility] [address] [place] [address] Phone: [phone]   Assessment and Plan:     1. Left wrist pain (Primary) 2. De Quervain's tenosynovitis, left -Chronic with exacerbation, subsequent visit - Continued left wrist pain, primarily radial sided.  Still most consistent with DeQuervain's Tenosynovitis.  Differential includes CMC versus DRUJ osteoarthritis - Patient had partial improvement after DeQuervain's Tenosynovitis CSI performed on 12/12/2024, however continues to experience soreness, mild swelling, pain with use - Recommend further evaluation with MRI of left wrist due to incomplete improvement despite >6 weeks of conservative therapy, pain with day-to-day activities, pain >6/10 - Use meloxicam  15 mg daily as needed for breakthrough pain.  Recommend limiting chronic NSAIDs to 1-2 doses per week to prevent long-term side effects. Use Tylenol  500 to 1000 mg tablets 2-3 times a day as needed for day-to-day pain relief.   Patient had previously been using meloxicam  daily for years and we discussed the potential for long-term side effects with chronic NSAID use - Recommend using wrist brace until reevaluated - Continued HEP for wrist  3. Claustrophobia -Ativan  0.5 mg once 30 to 60 minutes prior to MRI provided due to patient's claustrophobia      Pertinent previous records reviewed include none   Follow Up: 1 week after MRI to review results and discuss treatment plan   Subjective:   I, Cathy [name], am serving as a neurosurgeon for Doctor Morene Mace   Chief Complaint: left wrist pain    HPI:    11/16/2024 Patient is a 84 year old female with left wrist pain. Patient states she pushed a heavy box. She was given a brace. Injury happened a couple of weeks ago. Tylenol  does not help. No numbness or tingling. Decreased ROM. Grip strength intact . Pain is increased at night. Pain starts thumb and up the wrist     12/12/2024 Patient states prednisone helped a little but she still has pain  01/10/2025 Patient states she is a little  better but still has pain and soreness    Relevant Historical Information: GERD    Additional pertinent review of systems negative.  Current Medications[1]   Objective:     Vitals:   01/10/25 1327  BP: 130/76  Pulse: (!) 58  SpO2: (!) 9%  Weight: 169 lb (76.7 kg)  Height: 5' 5 (1.651 m)      Body mass index is 28.12 kg/m.    Physical Exam:    General: Appears well, nad, nontoxic and pleasant Neuro:sensation intact, strength is 5/5 in upper extremities, muscle tone wnl Skin:no susupicious lesions or rashes   Left hand/Wrist:   No deformity or swelling appreciated. ROM  Ext 90, flexion 70, radial/ulnar deviation 30 TTP radial styloid, lateral forearm, first MTP, CMC nttp over the snuff box, dorsal carpals, volar carpals,   ulnar styloid,   tfcc Negative Tinel's, Phalen's, Prayer Tests Positive finklestein, though less painful compared with prior office visit Neg tfcc bounce test No pain with resisted ext, flex or deviation    Electronically signed by:  Odis Mace D.[facility]MENTE[facility] 1:41 PM 01/10/25     [1]  Current Outpatient Medications:    LORazepam  (ATIVAN ) 0.5 MG tablet, 1-2 tabs 30 - 60 min prior to MRI. Do not drive with this medicine., Disp: 4 tablet, Rfl: 0   albuterol  (PROVENTIL  HFA) 108 (90 Base) MCG/ACT inhaler, Inhale  1-2 puffs into the lungs every 6 (six) hours as needed for wheezing or shortness of breath., Disp: 1 each, Rfl: 6   atorvastatin  (LIPITOR) 40 MG tablet, Take 1 tablet (40 mg total) by mouth daily., Disp: 90 tablet, Rfl: 3   carvedilol (COREG) 6.25 MG tablet, Take 1 tablet by mouth 2 (two) times daily with a meal., Disp: , Rfl:    cetirizine (ZYRTEC) 10 MG tablet, Take 10 mg by mouth daily., Disp: , Rfl:    famotidine  (PEPCID ) 20 MG tablet, TAKE 1 TABLET (20 MG TOTAL) BY MOUTH DAILY BEFORE LUNCH., Disp:  90 tablet, Rfl: 1   fluticasone -salmeterol (ADVAIR HFA) 115-21 MCG/ACT inhaler, Inhale 2 puffs into the lungs 2 (two) times daily., Disp: 1 each, Rfl: 11   ipratropium-albuterol  (DUONEB) 0.5-2.5 (3) MG/3ML SOLN, Take 3 mLs by nebulization every 6 (six) hours as needed., Disp: 180 mL, Rfl: 11   levothyroxine (SYNTHROID) 50 MCG tablet, Take 50 mcg by mouth., Disp: , Rfl:    meloxicam  (MOBIC ) 15 MG tablet, Take 1 tablet (15 mg total) by mouth daily as needed for pain., Disp: , Rfl:    methylPREDNISolone  (MEDROL  DOSEPAK) 4 MG TBPK tablet, Take 6 tablets on day 1.  Take 5 tablets on day 2.  Take 4 tablets on day 3.  Take 3 tablets on day 4.  Take 2 tablets on day 5.  Take 1 tablet on day 6., Disp: 21 tablet, Rfl: 0   polyethylene glycol powder (GLYCOLAX/MIRALAX) 17 GM/SCOOP powder, Take 1 Container by mouth as needed., Disp: , Rfl:    RABEprazole  (ACIPHEX ) 20 MG tablet, TAKE 1 TABLET BY MOUTH EVERY DAY, Disp: 90 tablet, Rfl: 1   Vitamin D, Ergocalciferol, (DRISDOL) 50000 units CAPS capsule, Take 1 capsule by mouth once a week., Disp: , Rfl:   "

## 2025-01-26 ENCOUNTER — Other Ambulatory Visit

## 2025-02-02 ENCOUNTER — Ambulatory Visit: Admitting: Sports Medicine
